# Patient Record
Sex: Female | Born: 1970
Health system: Southern US, Community
[De-identification: ages and names within clinical notes are randomized; demographics above are authoritative.]

## PROBLEM LIST (undated history)

## (undated) DIAGNOSIS — Z98811 Dental restoration status: Secondary | ICD-10-CM

## (undated) DIAGNOSIS — R112 Nausea with vomiting, unspecified: Secondary | ICD-10-CM

## (undated) DIAGNOSIS — Z8673 Personal history of transient ischemic attack (TIA), and cerebral infarction without residual deficits: Secondary | ICD-10-CM

## (undated) DIAGNOSIS — Q2112 Patent foramen ovale: Secondary | ICD-10-CM

## (undated) DIAGNOSIS — S62619A Displaced fracture of proximal phalanx of unspecified finger, initial encounter for closed fracture: Secondary | ICD-10-CM

## (undated) DIAGNOSIS — Q211 Atrial septal defect: Secondary | ICD-10-CM

## (undated) DIAGNOSIS — G43909 Migraine, unspecified, not intractable, without status migrainosus: Secondary | ICD-10-CM

## (undated) DIAGNOSIS — Z9889 Other specified postprocedural states: Secondary | ICD-10-CM

## (undated) DIAGNOSIS — R0989 Other specified symptoms and signs involving the circulatory and respiratory systems: Secondary | ICD-10-CM

## (undated) DIAGNOSIS — S80811A Abrasion, right lower leg, initial encounter: Secondary | ICD-10-CM

## (undated) HISTORY — PX: MASTOIDECTOMY: SHX711

## (undated) HISTORY — PX: TMJ ARTHROPLASTY: SHX1066

## (undated) HISTORY — PX: TYMPANOSTOMY TUBE PLACEMENT: SHX32

---

## 2006-05-14 ENCOUNTER — Emergency Department (HOSPITAL_COMMUNITY): Admission: EM | Admit: 2006-05-14 | Discharge: 2006-05-14 | Payer: Self-pay | Admitting: Family Medicine

## 2006-12-10 ENCOUNTER — Ambulatory Visit (HOSPITAL_COMMUNITY): Admission: RE | Admit: 2006-12-10 | Discharge: 2006-12-10 | Payer: Self-pay | Admitting: *Deleted

## 2007-05-18 ENCOUNTER — Ambulatory Visit (HOSPITAL_COMMUNITY): Admission: RE | Admit: 2007-05-18 | Discharge: 2007-05-18 | Payer: Self-pay | Admitting: Gastroenterology

## 2007-06-16 ENCOUNTER — Emergency Department (HOSPITAL_COMMUNITY): Admission: EM | Admit: 2007-06-16 | Discharge: 2007-06-16 | Payer: Self-pay | Admitting: Family Medicine

## 2007-11-10 ENCOUNTER — Emergency Department (HOSPITAL_COMMUNITY): Admission: EM | Admit: 2007-11-10 | Discharge: 2007-11-10 | Payer: Self-pay | Admitting: Family Medicine

## 2008-05-10 ENCOUNTER — Emergency Department (HOSPITAL_COMMUNITY): Admission: EM | Admit: 2008-05-10 | Discharge: 2008-05-10 | Payer: Self-pay | Admitting: Emergency Medicine

## 2008-09-27 ENCOUNTER — Ambulatory Visit (HOSPITAL_COMMUNITY): Admission: RE | Admit: 2008-09-27 | Discharge: 2008-09-27 | Payer: Self-pay | Admitting: Obstetrics

## 2008-11-27 ENCOUNTER — Ambulatory Visit (HOSPITAL_COMMUNITY): Admission: RE | Admit: 2008-11-27 | Discharge: 2008-11-27 | Payer: Self-pay | Admitting: Obstetrics

## 2008-12-24 ENCOUNTER — Inpatient Hospital Stay (HOSPITAL_COMMUNITY): Admission: AD | Admit: 2008-12-24 | Discharge: 2008-12-27 | Payer: Self-pay | Admitting: Obstetrics and Gynecology

## 2009-01-01 ENCOUNTER — Ambulatory Visit (HOSPITAL_COMMUNITY): Admission: RE | Admit: 2009-01-01 | Discharge: 2009-01-01 | Payer: Self-pay | Admitting: Obstetrics

## 2009-02-23 ENCOUNTER — Inpatient Hospital Stay (HOSPITAL_COMMUNITY): Admission: AD | Admit: 2009-02-23 | Discharge: 2009-02-24 | Payer: Self-pay | Admitting: Obstetrics

## 2009-03-12 ENCOUNTER — Inpatient Hospital Stay (HOSPITAL_COMMUNITY): Admission: AD | Admit: 2009-03-12 | Discharge: 2009-03-12 | Payer: Self-pay | Admitting: Obstetrics and Gynecology

## 2009-04-21 DIAGNOSIS — Z8673 Personal history of transient ischemic attack (TIA), and cerebral infarction without residual deficits: Secondary | ICD-10-CM

## 2009-04-21 HISTORY — DX: Personal history of transient ischemic attack (TIA), and cerebral infarction without residual deficits: Z86.73

## 2009-04-25 ENCOUNTER — Inpatient Hospital Stay (HOSPITAL_COMMUNITY): Admission: RE | Admit: 2009-04-25 | Discharge: 2009-04-28 | Payer: Self-pay | Admitting: Obstetrics

## 2009-04-25 ENCOUNTER — Encounter (INDEPENDENT_AMBULATORY_CARE_PROVIDER_SITE_OTHER): Payer: Self-pay | Admitting: Obstetrics

## 2009-07-06 ENCOUNTER — Emergency Department (HOSPITAL_COMMUNITY): Admission: EM | Admit: 2009-07-06 | Discharge: 2009-07-06 | Payer: Self-pay | Admitting: Emergency Medicine

## 2009-09-28 ENCOUNTER — Emergency Department (HOSPITAL_COMMUNITY): Admission: EM | Admit: 2009-09-28 | Discharge: 2009-09-28 | Payer: Self-pay | Admitting: Family Medicine

## 2010-02-27 ENCOUNTER — Emergency Department (HOSPITAL_COMMUNITY): Admission: EM | Admit: 2010-02-27 | Discharge: 2010-02-27 | Payer: Self-pay | Admitting: Emergency Medicine

## 2010-07-02 LAB — POCT PREGNANCY, URINE: Preg Test, Ur: NEGATIVE

## 2010-07-02 LAB — CBC
MCH: 28.2 pg (ref 26.0–34.0)
MCV: 84.4 fL (ref 78.0–100.0)
Platelets: 257 10*3/uL (ref 150–400)
RDW: 15.1 % (ref 11.5–15.5)

## 2010-07-02 LAB — DIFFERENTIAL
Eosinophils Absolute: 0 10*3/uL (ref 0.0–0.7)
Eosinophils Relative: 0 % (ref 0–5)
Lymphs Abs: 1.8 10*3/uL (ref 0.7–4.0)

## 2010-07-02 LAB — URINALYSIS, ROUTINE W REFLEX MICROSCOPIC
Bilirubin Urine: NEGATIVE
Glucose, UA: NEGATIVE mg/dL
Ketones, ur: NEGATIVE mg/dL
Protein, ur: 100 mg/dL — AB

## 2010-07-02 LAB — POCT I-STAT, CHEM 8
BUN: 13 mg/dL (ref 6–23)
Calcium, Ion: 1.13 mmol/L (ref 1.12–1.32)
TCO2: 27 mmol/L (ref 0–100)

## 2010-07-02 LAB — URINE MICROSCOPIC-ADD ON

## 2010-07-02 LAB — URINE CULTURE

## 2010-07-02 LAB — POCT RAPID STREP A (OFFICE): Streptococcus, Group A Screen (Direct): NEGATIVE

## 2010-07-07 LAB — CBC
HCT: 24.3 % — ABNORMAL LOW (ref 36.0–46.0)
HCT: 35.7 % — ABNORMAL LOW (ref 36.0–46.0)
Hemoglobin: 11.8 g/dL — ABNORMAL LOW (ref 12.0–15.0)
Hemoglobin: 8.2 g/dL — ABNORMAL LOW (ref 12.0–15.0)
Platelets: 171 10*3/uL (ref 150–400)
Platelets: 186 10*3/uL (ref 150–400)
RBC: 2.83 MIL/uL — ABNORMAL LOW (ref 3.87–5.11)
RBC: 3.99 MIL/uL (ref 3.87–5.11)
WBC: 12.2 10*3/uL — ABNORMAL HIGH (ref 4.0–10.5)
WBC: 15.6 10*3/uL — ABNORMAL HIGH (ref 4.0–10.5)
WBC: 16.1 10*3/uL — ABNORMAL HIGH (ref 4.0–10.5)

## 2010-07-07 LAB — GLUCOSE, CAPILLARY
Glucose-Capillary: 70 mg/dL (ref 70–99)
Glucose-Capillary: 79 mg/dL (ref 70–99)

## 2010-07-15 LAB — URINE MICROSCOPIC-ADD ON

## 2010-07-15 LAB — DIFFERENTIAL
Basophils Absolute: 0 10*3/uL (ref 0.0–0.1)
Eosinophils Relative: 7 % — ABNORMAL HIGH (ref 0–5)
Lymphocytes Relative: 24 % (ref 12–46)
Monocytes Absolute: 0.6 10*3/uL (ref 0.1–1.0)
Monocytes Relative: 7 % (ref 3–12)
Neutro Abs: 5 10*3/uL (ref 1.7–7.7)

## 2010-07-15 LAB — URINALYSIS, ROUTINE W REFLEX MICROSCOPIC
Glucose, UA: NEGATIVE mg/dL
Specific Gravity, Urine: 1.034 — ABNORMAL HIGH (ref 1.005–1.030)
Urobilinogen, UA: 2 mg/dL — ABNORMAL HIGH (ref 0.0–1.0)
pH: 5 (ref 5.0–8.0)

## 2010-07-15 LAB — CBC
HCT: 36 % (ref 36.0–46.0)
Hemoglobin: 11.6 g/dL — ABNORMAL LOW (ref 12.0–15.0)
RBC: 4.18 MIL/uL (ref 3.87–5.11)
RDW: 15.8 % — ABNORMAL HIGH (ref 11.5–15.5)

## 2010-07-15 LAB — BASIC METABOLIC PANEL
CO2: 26 mEq/L (ref 19–32)
GFR calc Af Amer: >60 mL/min (ref >60)
GFR calc non Af Amer: >60 mL/min (ref >60)
Glucose, Bld: 103 mg/dL — ABNORMAL HIGH (ref 70–99)
Potassium: 3.9 mEq/L (ref 3.5–5.1)
Sodium: 139 mEq/L (ref 135–145)

## 2010-07-23 ENCOUNTER — Other Ambulatory Visit: Payer: Self-pay | Admitting: Obstetrics

## 2010-07-24 LAB — CBC
HCT: 34 % — ABNORMAL LOW (ref 36.0–46.0)
Hemoglobin: 11.8 g/dL — ABNORMAL LOW (ref 12.0–15.0)
MCHC: 33 g/dL (ref 30.0–36.0)
Platelets: 233 10*3/uL (ref 150–400)
Platelets: 223 10*3/uL (ref 150–400)
RDW: 14.1 % (ref 11.5–15.5)
WBC: 12.4 10*3/uL — ABNORMAL HIGH (ref 4.0–10.5)

## 2010-07-24 LAB — CULTURE, BETA STREP (GROUP B ONLY)

## 2010-07-24 LAB — URINALYSIS, ROUTINE W REFLEX MICROSCOPIC
Bilirubin Urine: NEGATIVE
Glucose, UA: NEGATIVE mg/dL
Ketones, ur: NEGATIVE mg/dL
Leukocytes, UA: NEGATIVE
pH: 6 (ref 5.0–8.0)

## 2010-07-24 LAB — URINE MICROSCOPIC-ADD ON

## 2010-07-24 LAB — FETAL FIBRONECTIN: Fetal Fibronectin: NEGATIVE

## 2010-07-26 LAB — DIFFERENTIAL
Eosinophils Absolute: 0.2 10*3/uL (ref 0.0–0.7)
Eosinophils Relative: 0 % (ref 0–5)
Eosinophils Relative: 2 % (ref 0–5)
Lymphocytes Relative: 28 % (ref 12–46)
Lymphocytes Relative: 9 % — ABNORMAL LOW (ref 12–46)
Lymphs Abs: 1.5 10*3/uL (ref 0.7–4.0)
Lymphs Abs: 3.1 10*3/uL (ref 0.7–4.0)
Monocytes Absolute: 1 10*3/uL (ref 0.1–1.0)
Monocytes Absolute: 1 10*3/uL (ref 0.1–1.0)
Neutro Abs: 13.5 10*3/uL — ABNORMAL HIGH (ref 1.7–7.7)

## 2010-07-26 LAB — URINALYSIS, ROUTINE W REFLEX MICROSCOPIC
Bilirubin Urine: NEGATIVE
Ketones, ur: 15 mg/dL — AB
Protein, ur: 100 mg/dL — AB
Urobilinogen, UA: 4 mg/dL — ABNORMAL HIGH (ref 0.0–1.0)

## 2010-07-26 LAB — COMPREHENSIVE METABOLIC PANEL
AST: 29 U/L (ref 0–37)
Albumin: 2.9 g/dL — ABNORMAL LOW (ref 3.5–5.2)
BUN: 6 mg/dL (ref 6–23)
Calcium: 8.7 mg/dL (ref 8.4–10.5)
Creatinine, Ser: 0.66 mg/dL (ref 0.4–1.2)
GFR calc Af Amer: >60 mL/min (ref >60)

## 2010-07-26 LAB — CBC
HCT: 30.5 % — ABNORMAL LOW (ref 36.0–46.0)
Hemoglobin: 10.6 g/dL — ABNORMAL LOW (ref 12.0–15.0)
MCHC: 34.2 g/dL (ref 30.0–36.0)
MCV: 94.2 fL (ref 78.0–100.0)
MCV: 95.1 fL (ref 78.0–100.0)
Platelets: 198 10*3/uL (ref 150–400)
Platelets: 215 10*3/uL (ref 150–400)
RDW: 13.3 % (ref 11.5–15.5)
WBC: 11.1 10*3/uL — ABNORMAL HIGH (ref 4.0–10.5)

## 2010-07-26 LAB — URINE CULTURE: Colony Count: 35000

## 2010-07-26 LAB — URINE MICROSCOPIC-ADD ON

## 2010-09-03 NOTE — Op Note (Signed)
Christina Goodman, Christina Goodman                ACCOUNT NO.:  [id number]   MEDICAL RECORD NO.:  [id number]          PATIENT TYPE:  AMB   LOCATION:  ENDO                         FACILITY:  [facility]   PHYSICIAN:  John C. Madilyn Fireman, M.D.    DATE OF BIRTH:  20-Dec-1970   DATE OF PROCEDURE:  05/18/2007  DATE OF DISCHARGE:                               OPERATIVE REPORT   PROCEDURE:  Colonoscopy.   INDICATIONS FOR PROCEDURE:  Intermittent rectal bleeding with no obvious  perianal source, 40 year old patient with no prior colon screening.   PROCEDURE:  The patient was placed in the left lateral decubitus  position and placed on pulse monitor with continuous low-flow oxygen  delivered by nasal cannula.  She was sedated with 125 mcg IV fentanyl  and 10 mg IV Versed.  The Olympus video colonoscope was inserted into  the rectum and advanced to the cecum, confirmed by transillumination of  McBurney's point and visualization of ileocecal valve and appendiceal  orifice.  The prep was excellent.  The Olympus video colonoscope was  inserted into the rectum and advanced to the cecum, confirmed by  transillumination of McBurney's point and visualization of ileocecal  valve and appendiceal orifice.  The prep was excellent.  The cecum,  ascending, transverse, descending and sigmoid colon all appeared normal  with no masses, polyps, diverticula or other mucosal abnormalities.  The  rectum likewise appeared normal and retroflexed view of the anus  revealed only small internal hemorrhoids.  The scope was then withdrawn  and the patient returned to the recovery room in stable condition.  She  tolerated the procedure well and there were no immediate complications.   IMPRESSION:  1. Minimal internal hemorrhoids otherwise normal study.   PLAN:  Will treat hemorrhoids symptomatically and plan repeat  colonoscopy in age 57.           ______________________________  Everardo All. Madilyn Fireman, M.D.     J[facility]/MEDQ  D:  05/18/2007  T:   05/18/2007  Job:  [id number]

## 2011-01-10 LAB — POCT RAPID STREP A: Streptococcus, Group A Screen (Direct): NEGATIVE

## 2011-05-08 ENCOUNTER — Emergency Department (HOSPITAL_COMMUNITY)
Admission: EM | Admit: 2011-05-08 | Discharge: 2011-05-08 | Disposition: A | Discharge status: Home / Self Care | Payer: 59 | Source: Home / Self Care | Attending: Family Medicine | Admitting: Family Medicine

## 2011-05-08 ENCOUNTER — Encounter (HOSPITAL_COMMUNITY): Payer: Self-pay | Admitting: *Deleted

## 2011-05-08 ENCOUNTER — Emergency Department (INDEPENDENT_AMBULATORY_CARE_PROVIDER_SITE_OTHER): Payer: 59

## 2011-05-08 DIAGNOSIS — E860 Dehydration: Secondary | ICD-10-CM

## 2011-05-08 DIAGNOSIS — J40 Bronchitis, not specified as acute or chronic: Secondary | ICD-10-CM

## 2011-05-08 MED ORDER — IBUPROFEN 600 MG PO TABS: 600.0000 mg | ORAL_TABLET | Freq: Three times a day (TID) | ORAL | Status: AC | PRN

## 2011-05-08 MED ORDER — ONDANSETRON HCL 4 MG PO TABS: 4.0000 mg | ORAL_TABLET | Freq: Three times a day (TID) | ORAL | Status: AC | PRN

## 2011-05-08 MED ORDER — PREDNISONE 20 MG PO TABS: ORAL_TABLET | ORAL | Status: AC

## 2011-05-08 MED ORDER — AZITHROMYCIN 250 MG PO TABS: 250.0000 mg | ORAL_TABLET | Freq: Every day | ORAL | Status: AC

## 2011-05-08 MED ORDER — HYDROCODONE-ACETAMINOPHEN 7.5-500 MG/15ML PO SOLN: 15.0000 mL | Freq: Three times a day (TID) | ORAL | Status: AC | PRN

## 2011-05-08 NOTE — ED Notes (Signed)
Pt is here with complaints of flu like symptoms with complaints of fever,  Cough with yellow sputum, body aches and sore throat since 05/02/11.

## 2011-05-08 NOTE — ED Provider Notes (Signed)
History     CSN: [id number]  Arrival date & time 05/08/11  4098   First MD Initiated Contact with Patient 05/08/11 0831      Chief Complaint  Patient presents with  . Cough  . Headache  . Sore Throat    (Consider location/radiation/quality/duration/timing/severity/associated sxs/prior treatment) HPI Comments: 41 y/o female h/o POS here c/o body aches, sore throat, cough and congestion for 6 days. New onset of fever since last night also persistent cough with yellow sputum. Has had nausea, vomiting and diarrhea initially last emesis yesterday. Decreased appetite, no chest or abdominal pain, no shortness of breath.    Past Medical History  Diagnosis Date  . Migraine     Past Surgical History  Procedure Date  . Tympanostomy tube placement   . Mastoidectomy   . Tmj arthroplasty     History reviewed. No pertinent family history.  History  Substance Use Topics  . Smoking status: Never Smoker   . Smokeless tobacco: Not on file  . Alcohol Use: No    OB History    Grav Para Term Preterm Abortions TAB SAB Ect Mult Living                  Review of Systems  Constitutional: Positive for fever, chills and appetite change.  HENT: Positive for congestion, sore throat and rhinorrhea.   Eyes: Negative for discharge.  Respiratory: Positive for cough. Negative for shortness of breath.   Cardiovascular: Negative for palpitations and leg swelling.  Gastrointestinal: Positive for nausea, vomiting and diarrhea. Negative for abdominal pain.  Musculoskeletal: Positive for myalgias and arthralgias.  Skin: Negative for rash.    Allergies  Amoxicillin; Ivp dye; Penicillins cross reactors; and Sulfa antibiotics  Home Medications   Current Outpatient Rx  Name Route Sig Dispense Refill  . METFORMIN HCL 500 MG PO TABS Oral Take 500 mg by mouth 2 (two) times daily with a meal.    . AZITHROMYCIN 250 MG PO TABS Oral Take 1 tablet (250 mg total) by mouth daily. Take first 2 tablets  together, then 1 every day until finished. 6 tablet 0  . HYDROCODONE-ACETAMINOPHEN 7.5-500 MG/15ML PO SOLN Oral Take 15 mLs by mouth every 8 (eight) hours as needed for pain or cough. 120 mL 0  . IBUPROFEN 600 MG PO TABS Oral Take 1 tablet (600 mg total) by mouth every 8 (eight) hours as needed for pain or fever (take with food). 20 tablet 0  . ONDANSETRON HCL 4 MG PO TABS Oral Take 1 tablet (4 mg total) by mouth every 8 (eight) hours as needed for nausea. 10 tablet 0  . PREDNISONE 20 MG PO TABS  2 tabs po daily for 5 days 10 tablet no    BP 124/76  Pulse 86  Temp(Src) 99.1 F (37.3 C) (Oral)  SpO2 97%  LMP 03/08/2011  Physical Exam  Nursing note and vitals reviewed. Constitutional: She is oriented to person, place, and time. She appears well-developed and well-nourished. No distress.  HENT:  Head: Normocephalic.       Nasal Congestion with erythema and swelling of nasal turbinates, clear rhinorrhea. Septal deviation to left side with partial occlusion on left nasal passage. Pharyngeal erythema no exudates. No uvula deviation. No trismus. TM's with increased vascular markings and some dullness bilaterally no swelling or bulging   Eyes: Conjunctivae and EOM are normal. Pupils are equal, round, and reactive to light. Right eye exhibits no discharge. Left eye exhibits no discharge. No scleral icterus.  Neck: Neck supple.  Pulmonary/Chest: No respiratory distress.       Sporadic bilateral rhonchi that resolved after repeated deep breathing. No active wheezing. No crackles, no orthopnea or tachypnea.  Lymphadenopathy:    She has no cervical adenopathy.  Neurological: She is alert and oriented to person, place, and time.  Skin: No rash noted.    ED Course  Procedures (including critical care time)  Labs Reviewed - No data to display Dg Chest 2 View  05/08/2011  *RADIOLOGY REPORT*  Clinical Data: Cough, fever  CHEST - 2 VIEW  Comparison: 02/27/2010  Findings: Cardiomediastinal  silhouette is stable.  No acute infiltrate or pleural effusion.  No pulmonary edema.  Minimal central increased bronchial markings without focal consolidation.  IMPRESSION: .  No acute infiltrate or pulmonary edema.  Minimal central increased bronchial markings without focal consolidation.  Original Report Authenticated By: Natasha Mead, M.D.     1. Dehydration   2. Bronchitis       MDM  Likely influenza related bronchitis. Patient was able to drink from hydration cup with no emesis. Tachycardia and blood pressure improved with oral hydration. Prescribed azithromycin, prednisone, hydrocodone, ondansetron and ibuprofen. Asked to return if chest pain difficulty breathing or worsening symptoms despite following treatment.         Sharin Grave, MD 05/09/11 [phone]

## 2011-07-05 ENCOUNTER — Encounter (HOSPITAL_COMMUNITY): Payer: Self-pay | Admitting: *Deleted

## 2011-07-05 ENCOUNTER — Emergency Department (HOSPITAL_COMMUNITY)
Admission: EM | Admit: 2011-07-05 | Discharge: 2011-07-05 | Disposition: A | Discharge status: Home / Self Care | Payer: 59 | Source: Home / Self Care | Attending: Family Medicine | Admitting: Family Medicine

## 2011-07-05 DIAGNOSIS — J069 Acute upper respiratory infection, unspecified: Secondary | ICD-10-CM

## 2011-07-05 LAB — POCT RAPID STREP A: Streptococcus, Group A Screen (Direct): NEGATIVE

## 2011-07-05 MED ORDER — AZITHROMYCIN 250 MG PO TABS: ORAL_TABLET | ORAL | Status: AC

## 2011-07-05 NOTE — Discharge Instructions (Signed)
Drink lots of fluids, salt water gargle, use lozenges as needed. Fill antibiotic prescription on sun if sore throat continues,return if needed

## 2011-07-05 NOTE — ED Provider Notes (Signed)
History     CSN: [id number]  Arrival date & time 07/05/11  1355   First MD Initiated Contact with Patient 07/05/11 1401      Chief Complaint  Patient presents with  . Sore Throat  . Fever  . Generalized Body Aches    (Consider location/radiation/quality/duration/timing/severity/associated sxs/prior treatment) Patient is a 41 y.o. female presenting with pharyngitis and fever. The history is provided by the patient.  Sore Throat This is a new problem. The current episode started 2 days ago. The problem has been gradually worsening. The symptoms are aggravated by swallowing.  Fever Primary symptoms of the febrile illness include fever and cough.    Past Medical History  Diagnosis Date  . Migraine   . Polycystic ovarian disease     Past Surgical History  Procedure Date  . Tympanostomy tube placement   . Mastoidectomy   . Tmj arthroplasty     History reviewed. No pertinent family history.  History  Substance Use Topics  . Smoking status: Never Smoker   . Smokeless tobacco: Not on file  . Alcohol Use: No    OB History    Grav Para Term Preterm Abortions TAB SAB Ect Mult Living                  Review of Systems  Constitutional: Positive for fever.  HENT: Positive for congestion, sore throat and rhinorrhea.   Respiratory: Positive for cough.   Gastrointestinal: Negative.     Allergies  Amoxicillin; Ivp dye; Penicillins cross reactors; and Sulfa antibiotics  Home Medications   Current Outpatient Rx  Name Route Sig Dispense Refill  . ACETAMINOPHEN 500 MG PO TABS Oral Take 500 mg by mouth every 6 (six) hours as needed.    Marland Kitchen METFORMIN HCL 500 MG PO TABS Oral Take 500 mg by mouth 2 (two) times daily with a meal.    . AZITHROMYCIN 250 MG PO TABS  Take as directed on pack 6 each 0    BP 127/76  Pulse 120  Temp(Src) 100.3 F (37.9 C) (Oral)  Resp 22  SpO2 98%  LMP 05/31/2011  Physical Exam  Nursing note and vitals reviewed. Constitutional: She is  oriented to person, place, and time. She appears well-developed and well-nourished.  HENT:  Head: Normocephalic.  Right Ear: External ear normal.  Left Ear: External ear normal.  Nose: Nose normal.  Mouth/Throat: Oropharynx is clear and moist.  Eyes: Conjunctivae are normal. Pupils are equal, round, and reactive to light.  Neck: Normal range of motion. Neck supple.  Cardiovascular: Normal rate, regular rhythm, normal heart sounds and intact distal pulses.   Pulmonary/Chest: Breath sounds normal.  Lymphadenopathy:    She has no cervical adenopathy.  Neurological: She is alert and oriented to person, place, and time.  Skin: Skin is warm and dry.    ED Course  Procedures (including critical care time)   Labs Reviewed  POCT RAPID STREP A ([facility] ONLY)   No results found.   1. URI (upper respiratory infection)       MDM          Linna Hoff, MD 07/05/11 [phone]

## 2011-07-05 NOTE — ED Notes (Signed)
Pt with onset of sore throat/ aching/cough  and fever onset Thursday night worse today - tylenol at 1130am

## 2012-06-05 ENCOUNTER — Other Ambulatory Visit: Payer: Self-pay

## 2013-02-24 ENCOUNTER — Other Ambulatory Visit: Payer: Self-pay

## 2013-06-24 ENCOUNTER — Other Ambulatory Visit (HOSPITAL_COMMUNITY): Payer: Self-pay | Admitting: Neurology

## 2013-06-24 DIAGNOSIS — R519 Headache, unspecified: Secondary | ICD-10-CM

## 2013-06-24 DIAGNOSIS — R51 Headache: Principal | ICD-10-CM

## 2013-06-27 ENCOUNTER — Ambulatory Visit (HOSPITAL_COMMUNITY)
Admission: RE | Admit: 2013-06-27 | Discharge: 2013-06-27 | Disposition: A | Discharge status: Home / Self Care | Payer: 59 | Source: Ambulatory Visit | Attending: Neurology | Admitting: Neurology

## 2013-06-27 DIAGNOSIS — G43909 Migraine, unspecified, not intractable, without status migrainosus: Secondary | ICD-10-CM | POA: Insufficient documentation

## 2013-06-27 DIAGNOSIS — R519 Headache, unspecified: Secondary | ICD-10-CM

## 2013-06-27 DIAGNOSIS — R110 Nausea: Secondary | ICD-10-CM | POA: Insufficient documentation

## 2013-06-27 DIAGNOSIS — R51 Headache: Secondary | ICD-10-CM

## 2013-06-29 ENCOUNTER — Other Ambulatory Visit (HOSPITAL_COMMUNITY): Payer: Self-pay | Admitting: Neurology

## 2013-06-29 DIAGNOSIS — I898 Other specified noninfective disorders of lymphatic vessels and lymph nodes: Secondary | ICD-10-CM

## 2013-06-30 ENCOUNTER — Ambulatory Visit (HOSPITAL_COMMUNITY): Payer: 59

## 2013-06-30 ENCOUNTER — Ambulatory Visit (HOSPITAL_COMMUNITY)
Admission: RE | Admit: 2013-06-30 | Discharge: 2013-06-30 | Disposition: A | Discharge status: Home / Self Care | Payer: 59 | Source: Ambulatory Visit | Attending: Neurology | Admitting: Neurology

## 2013-06-30 DIAGNOSIS — I898 Other specified noninfective disorders of lymphatic vessels and lymph nodes: Secondary | ICD-10-CM

## 2013-06-30 DIAGNOSIS — I658 Occlusion and stenosis of other precerebral arteries: Secondary | ICD-10-CM | POA: Insufficient documentation

## 2013-06-30 DIAGNOSIS — Z8673 Personal history of transient ischemic attack (TIA), and cerebral infarction without residual deficits: Secondary | ICD-10-CM | POA: Insufficient documentation

## 2013-06-30 DIAGNOSIS — R51 Headache: Secondary | ICD-10-CM | POA: Insufficient documentation

## 2013-06-30 MED ORDER — GADOBENATE DIMEGLUMINE 529 MG/ML IV SOLN
20.0000 mL | Freq: Once | INTRAVENOUS | Status: AC | PRN
  Administered 2013-06-30: 17 mL via INTRAVENOUS

## 2013-07-01 ENCOUNTER — Other Ambulatory Visit (HOSPITAL_COMMUNITY): Payer: Self-pay | Admitting: Neurology

## 2013-07-01 DIAGNOSIS — R9389 Abnormal findings on diagnostic imaging of other specified body structures: Secondary | ICD-10-CM

## 2013-07-04 ENCOUNTER — Ambulatory Visit (HOSPITAL_COMMUNITY)
Admission: RE | Admit: 2013-07-04 | Discharge: 2013-07-04 | Disposition: A | Discharge status: Home / Self Care | Payer: 59 | Source: Ambulatory Visit | Attending: Neurology | Admitting: Neurology

## 2013-07-04 DIAGNOSIS — R9389 Abnormal findings on diagnostic imaging of other specified body structures: Secondary | ICD-10-CM

## 2013-07-04 DIAGNOSIS — Q283 Other malformations of cerebral vessels: Secondary | ICD-10-CM | POA: Insufficient documentation

## 2013-07-04 MED ORDER — IOHEXOL 350 MG/ML SOLN
50.0000 mL | Freq: Once | INTRAVENOUS | Status: AC | PRN
  Administered 2013-07-04: 50 mL via INTRAVENOUS

## 2013-07-14 ENCOUNTER — Encounter: Payer: Self-pay | Admitting: Cardiovascular Disease

## 2013-08-02 ENCOUNTER — Encounter: Payer: Self-pay | Admitting: Cardiovascular Disease

## 2013-08-02 ENCOUNTER — Ambulatory Visit (INDEPENDENT_AMBULATORY_CARE_PROVIDER_SITE_OTHER): Payer: 59 | Admitting: Cardiovascular Disease

## 2013-08-02 VITALS — BP 112/88 | HR 73 | Ht 69.0 in | Wt 193.0 lb

## 2013-08-02 DIAGNOSIS — G43909 Migraine, unspecified, not intractable, without status migrainosus: Secondary | ICD-10-CM

## 2013-08-02 DIAGNOSIS — I639 Cerebral infarction, unspecified: Secondary | ICD-10-CM | POA: Insufficient documentation

## 2013-08-02 DIAGNOSIS — I6350 Cerebral infarction due to unspecified occlusion or stenosis of unspecified cerebral artery: Secondary | ICD-10-CM

## 2013-08-02 NOTE — Patient Instructions (Signed)
Your physician recommends that you schedule a follow-up appointment as needed with Dr. Angelena Form   Your physician has requested that you have an echocardiogram with bubble study. . Echocardiography is a painless test that uses sound waves to create images of your heart. It provides your doctor with information about the size and shape of your heart and how well your heart's chambers and valves are working. This procedure takes approximately one hour. There are no restrictions for this procedure.

## 2013-08-02 NOTE — Progress Notes (Signed)
History of Present Illness: 42 yo female with history of migraine headaches, polycystic ovarian disease who is here today as a new patient for discussion regarding arranging an echo to exclude PFO/ASD. She has a long history of migraine headaches. Recent brain MRI with possible cerebellar infarction. No prior cardiac murmurs. No prior cardiac conditions. She has had no chest pain or SOB. She does describe over the last two weeks feeling her heart "pound" and then skip a beat followed by dyspnea. No near syncope or syncope.   Primary Care Physician: Michel Santee  Past Medical History  Diagnosis Date  . Migraine   . Polycystic ovarian disease     Past Surgical History  Procedure Laterality Date  . Tympanostomy tube placement    . Mastoidectomy    . Tmj arthroplasty      Current Outpatient Prescriptions  Medication Sig Dispense Refill  . aspirin 81 MG tablet Take 81 mg by mouth daily.      . cyclobenzaprine (FLEXERIL) 5 MG tablet Take 5 mg by mouth 3 (three) times daily as needed for muscle spasms.      . metFORMIN (GLUCOPHAGE) 500 MG tablet Take 500 mg by mouth 2 (two) times daily with a meal.      . oxaprozin (DAYPRO) 600 MG tablet Take by mouth daily.      Marland Kitchen oxyCODONE-acetaminophen (PERCOCET/ROXICET) 5-325 MG per tablet Take by mouth every 4 (four) hours as needed for severe pain.       No current facility-administered medications for this visit.    Allergies  Allergen Reactions  . Amoxicillin   . Ivp Dye [Iodinated Diagnostic Agents]     Pt did well with 13 hour premedication prior to cta neck on 07/04/2013  . Penicillins Cross Reactors     All "cillin" drugs!!  . Sulfa Antibiotics     History   Social History  . Marital Status: Single    Spouse Name: N/A    Number of Children: 0  . 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 Smoking status: Never Smoker   . Smokeless tobacco: Not on file  .  Alcohol Use: No  . Drug Use: No  . Sexual Activity: Yes    Birth Control/ Protection: Pill   Other Topics Concern  . Not on file   Social History Narrative  . No narrative on file    Family History  Problem Relation Age of Onset  . Cancer Mother 17  . CAD Father   . Heart attack Father   . Heart attack Paternal Grandfather   . Heart attack Paternal Grandmother   . Heart attack Maternal Grandfather   . Heart attack Maternal Grandmother     Review of Systems:  As stated in the HPI and otherwise negative.   BP 112/88  Pulse 73  Ht 5\' 9"  (1.753 m)  Wt 193 lb (87.544 kg)  BMI 28.49 kg/m2  Physical Examination: General: Well developed, well nourished, NAD HEENT: OP clear, mucus membranes moist SKIN: warm, dry. No rashes. Neuro: No focal deficits Musculoskeletal: Muscle strength 5/5 all ext Psychiatric: Mood and affect normal Neck: No JVD, no carotid bruits, no thyromegaly, no lymphadenopathy. Lungs:Clear bilaterally, no wheezes, rhonci, crackles Cardiovascular: Regular rate and rhythm. No murmurs, gallops or rubs. Abdomen:Soft. Bowel sounds present. Non-tender.  Extremities: No lower extremity edema. Pulses are 2 + in the bilateral DP/PT.  EKG:NSR, rate  73 bpm.   Assessment and Plan:   1. CVA: Definite abnormality on MRI/Head CT. She has no prior cardiac murmurs or cardiac conditions. She will need a transthoracic echo with bubble study to exclude PFO/ASD. TEE is not indicated at this time. If her bubble study is negative, it will be safe to assume that a atrial septal defect is not present. We will call her with the results of the echo.   2. Palpitations: She has rare palpitations over the last two weeks. This feels like a skipped beat. No prolonged tachycardia. If this persists, she will need a 48 hour monitor. Likely related to premature beats.

## 2013-08-11 ENCOUNTER — Encounter (HOSPITAL_COMMUNITY): Payer: Self-pay | Admitting: *Deleted

## 2013-08-11 ENCOUNTER — Ambulatory Visit (HOSPITAL_COMMUNITY): Payer: 59 | Attending: Internal Medicine | Admitting: Radiology

## 2013-08-11 ENCOUNTER — Encounter: Payer: Self-pay | Admitting: Internal Medicine

## 2013-08-11 DIAGNOSIS — I639 Cerebral infarction, unspecified: Secondary | ICD-10-CM

## 2013-08-11 DIAGNOSIS — I6350 Cerebral infarction due to unspecified occlusion or stenosis of unspecified cerebral artery: Secondary | ICD-10-CM | POA: Insufficient documentation

## 2013-08-11 DIAGNOSIS — I6789 Other cerebrovascular disease: Secondary | ICD-10-CM

## 2013-08-11 NOTE — Progress Notes (Signed)
Echocardiogram performed with Bubble Study.  

## 2013-08-11 NOTE — Progress Notes (Signed)
Patient ID: Christina Goodman, female   DOB: [birth year], 43 y.o.   MRN: [id number] 22G IV started  In RAC x1 for bubble study.  Site unremarkable. Crissie Figures, RN

## 2013-08-16 ENCOUNTER — Encounter: Payer: Self-pay | Admitting: *Deleted

## 2013-08-16 ENCOUNTER — Telehealth: Payer: Self-pay | Admitting: *Deleted

## 2013-08-16 NOTE — Telephone Encounter (Signed)
Spoke with pt regarding TEE. This has been scheduled for Aug 24, 2013 at 9:00. Pt aware to arrive at [facility] at 7:30 AM. I verbally went over all instructions with pt and will mail copy to her.  She had recent lab work done at Dr. Ovid Curd office and will have results faxed to [place].  She is aware BMP and CBC needed prior to TEE.

## 2013-08-17 NOTE — Addendum Note (Signed)
Addended by: Lauree Chandler D on: 08/17/2013 07:59 AM   Modules accepted: Orders

## 2013-08-22 ENCOUNTER — Other Ambulatory Visit (INDEPENDENT_AMBULATORY_CARE_PROVIDER_SITE_OTHER): Payer: 59

## 2013-08-22 ENCOUNTER — Telehealth: Payer: Self-pay | Admitting: Cardiovascular Disease

## 2013-08-22 DIAGNOSIS — I6350 Cerebral infarction due to unspecified occlusion or stenosis of unspecified cerebral artery: Secondary | ICD-10-CM

## 2013-08-22 NOTE — Telephone Encounter (Signed)
Note from Dr. Ovid Curd office is scanned in but no lab work with it.  I placed call to pt to discuss need for lab work prior to TEE. Left message to call back

## 2013-08-22 NOTE — Telephone Encounter (Signed)
New message ° ° °Patient returning call back to nurse.  °

## 2013-08-22 NOTE — Telephone Encounter (Signed)
Spoke with pt and she will have labs done in our office today or at [facility] or [facility] tomorrow.

## 2013-08-22 NOTE — Telephone Encounter (Signed)
See previous phone note regarding need for lab work prior to TEE.  Pt will have labs done either today in our office or tomorrow at [facility] or [place] depending on where she is working. Pt states she had dental work (crown) done day of echo. She will need follow up dental work. Is asking if she should be on antibiotics prior to dental work.  She is allergic to all penicillin drugs and sulfa antibiotics.

## 2013-08-23 LAB — CBC
HEMATOCRIT: 40.5 % (ref 36.0–46.0)
Hemoglobin: 13.4 g/dL (ref 12.0–15.0)
MCHC: 33 g/dL (ref 30.0–36.0)
MCV: 87.4 fl (ref 78.0–100.0)
Platelets: 304 10*3/uL (ref 150.0–400.0)
RBC: 4.64 Mil/uL (ref 3.87–5.11)
RDW: 13.4 % (ref 11.5–14.6)
WBC: 8.1 10*3/uL (ref 4.5–10.5)

## 2013-08-23 LAB — BASIC METABOLIC PANEL
BUN: 15 mg/dL (ref 6–23)
CALCIUM: 9.8 mg/dL (ref 8.4–10.5)
CO2: 30 mEq/L (ref 19–32)
Chloride: 103 mEq/L (ref 96–112)
Creatinine, Ser: 0.9 mg/dL (ref 0.4–1.2)
GFR: 77.47 mL/min (ref >60.00)
GLUCOSE: 88 mg/dL (ref 70–99)
Potassium: 5 mEq/L (ref 3.5–5.1)
SODIUM: 140 meq/L (ref 135–145)

## 2013-08-24 ENCOUNTER — Encounter (HOSPITAL_COMMUNITY)
Admission: RE | Disposition: A | Discharge status: Home / Self Care | Payer: Self-pay | Source: Ambulatory Visit | Attending: Cardiology

## 2013-08-24 ENCOUNTER — Ambulatory Visit (HOSPITAL_COMMUNITY)
Admission: RE | Admit: 2013-08-24 | Discharge: 2013-08-24 | Disposition: A | Discharge status: Home / Self Care | Payer: 59 | Source: Ambulatory Visit | Attending: Cardiology | Admitting: Cardiology

## 2013-08-24 DIAGNOSIS — I639 Cerebral infarction, unspecified: Secondary | ICD-10-CM

## 2013-08-24 DIAGNOSIS — I6789 Other cerebrovascular disease: Secondary | ICD-10-CM

## 2013-08-24 DIAGNOSIS — E282 Polycystic ovarian syndrome: Secondary | ICD-10-CM | POA: Insufficient documentation

## 2013-08-24 DIAGNOSIS — G43909 Migraine, unspecified, not intractable, without status migrainosus: Secondary | ICD-10-CM | POA: Insufficient documentation

## 2013-08-24 DIAGNOSIS — Q211 Atrial septal defect: Secondary | ICD-10-CM | POA: Insufficient documentation

## 2013-08-24 DIAGNOSIS — Z79899 Other long term (current) drug therapy: Secondary | ICD-10-CM | POA: Insufficient documentation

## 2013-08-24 DIAGNOSIS — Z7982 Long term (current) use of aspirin: Secondary | ICD-10-CM | POA: Insufficient documentation

## 2013-08-24 DIAGNOSIS — Q2111 Secundum atrial septal defect: Secondary | ICD-10-CM | POA: Insufficient documentation

## 2013-08-24 DIAGNOSIS — I6350 Cerebral infarction due to unspecified occlusion or stenosis of unspecified cerebral artery: Secondary | ICD-10-CM | POA: Insufficient documentation

## 2013-08-24 HISTORY — PX: TEE WITHOUT CARDIOVERSION: SHX5443

## 2013-08-24 SURGERY — ECHOCARDIOGRAM, TRANSESOPHAGEAL
Anesthesia: Moderate Sedation

## 2013-08-24 MED ORDER — SODIUM CHLORIDE 0.9 % IV SOLN
INTRAVENOUS | Status: AC
  Administered 2013-08-24: 09:00:00 via INTRAVENOUS

## 2013-08-24 MED ORDER — FENTANYL CITRATE 0.05 MG/ML IJ SOLN
INTRAMUSCULAR | Status: AC
  Filled 2013-08-24: qty 2

## 2013-08-24 MED ORDER — MIDAZOLAM HCL 10 MG/2ML IJ SOLN
INTRAMUSCULAR | Status: DC | PRN
  Administered 2013-08-24: 1 mg via INTRAVENOUS
  Administered 2013-08-24 (×4): 2 mg via INTRAVENOUS

## 2013-08-24 MED ORDER — BUTAMBEN-TETRACAINE-BENZOCAINE 2-2-14 % EX AERO
INHALATION_SPRAY | CUTANEOUS | Status: DC | PRN
  Administered 2013-08-24: 2 via TOPICAL

## 2013-08-24 MED ORDER — FENTANYL CITRATE 0.05 MG/ML IJ SOLN
INTRAMUSCULAR | Status: DC | PRN
Start: 2013-08-24 — End: 2013-08-24
  Administered 2013-08-24: 50 ug via INTRAVENOUS
  Administered 2013-08-24 (×3): 25 ug via INTRAVENOUS

## 2013-08-24 MED ORDER — MIDAZOLAM HCL 5 MG/ML IJ SOLN
INTRAMUSCULAR | Status: AC
  Filled 2013-08-24: qty 2

## 2013-08-24 NOTE — Interval H&P Note (Signed)
History and Physical Interval Note:  08/24/2013 9:24 AM  Christina Goodman  has presented today for surgery, with the diagnosis of PFO  The various methods of treatment have been discussed with the patient and family. After consideration of risks, benefits and other options for treatment, the patient has consented to  Procedure(s): TRANSESOPHAGEAL ECHOCARDIOGRAM (TEE) (N/A) as a surgical intervention .  The patient's history has been reviewed, patient examined, no change in status, stable for surgery.  I have reviewed the patient's chart and labs.  Questions were answered to the patient's satisfaction.     Chelsey Redondo Claris Gladden

## 2013-08-24 NOTE — CV Procedure (Signed)
Procedure: TEE  Indication: CVA  Sedation: Versed 9 mg IV, Fentanyl 125 mcg IV  Findings: Please see echo section for full report.  Normal LV size and systolic function, EF 74-94%. Normal RV size and systolic function.  No significant valvular abnormalities.  There was a moderate-sized PFO present with bubbles crossing right to left at rest.  No LAA thrombus.  No significant thoracic aortic plaque.   PFO present.  This is the only possible source of embolus noted.   Larey Dresser 08/24/2013

## 2013-08-24 NOTE — Progress Notes (Signed)
  Echocardiogram Echocardiogram Transesophageal has been performed.  Christina Goodman Orlean Patten 08/24/2013, 12:16 PM

## 2013-08-24 NOTE — Discharge Instructions (Signed)
Esophagogastroduodenoscopy Care After Refer to this sheet in the next few weeks. These instructions provide you with information on caring for yourself after your procedure. Your caregiver may also give you more specific instructions. Your treatment has been planned according to current medical practices, but problems sometimes occur. Call your caregiver if you have any problems or questions after your procedure.  HOME CARE INSTRUCTIONS Do not eat or drink anything until the numbing medicine (local anesthetic) has worn off and your gag reflex has returned. You will know that the local anesthetic has worn off when you can swallow comfortably. Do not drive for 12 hours after the procedure or as directed by your caregiver. Only take medicines as directed by your caregiver. SEEK MEDICAL CARE IF:  You cannot stop coughing. You are not urinating at all or less than usual. SEEK IMMEDIATE MEDICAL CARE IF: You have difficulty swallowing. You cannot eat or drink. You have worsening throat or chest pain. You have dizziness, lightheadedness, or you faint. You have nausea or vomiting. You have chills. You have a fever. You have severe abdominal pain. You have black, tarry, or bloody stools. Document Released: 03/24/2012 Document Reviewed: 03/24/2012 [facility] Patient Information 2014 [place], [place]. Transesophageal Echocardiography Transesophageal echocardiography (TEE) is a special type of test that produces images of the heart by using sound waves (echocardiogram). This type of echocardiography can obtain better images of the heart than standard echocardiography. TEE is done by passing a flexible tube down the esophagus. The heart is located in front of the esophagus. Because the heart and esophagus are close to one another, your health care provider can take very clear, detailed pictures of the heart via ultrasound waves. TEE may be done:  If your health care provider needs more information based on  standard echocardiography findings.  If you had a stroke. This might have happened because a clot formed in your heart. TEE can visualize different areas of the heart and check for clots.  To check valve anatomy and function.  To check for infection on the inside of your heart (endocarditis).  To evaluate the dividing wall (septum) of the heart and presence of a hole that did not close after birth (patent foramen ovale or atrial septal defect).  To help diagnose a tear in the wall of the aorta (aortic dissection).  During cardiac valve surgery. This allows the surgeon to assess the valve repair before closing the chest.  During a variety of other cardiac procedures to guide positioning of catheters.  Sometimes before a cardioversion, which is a shock to convert heart rhythm back to normal. LET [facility] CARE PROVIDER KNOW ABOUT:   Any allergies you have.  All medicines you are taking, including vitamins, herbs, eye drops, creams, and over-the-counter medicines.  Previous problems you or members of your family have had with the use of anesthetics.  Any blood disorders you have.  Previous surgeries you have had.  Medical conditions you have.  Swallowing difficulties.  An esophageal obstruction. RISKS AND COMPLICATIONS  Generally, TEE is a safe procedure. However, as with any procedure, complications can occur. Possible complications include an esophageal tear (rupture). BEFORE THE PROCEDURE   Do not eat or drink for 6 hours before the procedure or as directed by your health care provider.  Arrange for someone to drive you home after the procedure. Do not drive yourself home. During the procedure, you will be given medicines that can continue to make you feel drowsy and can impair your reflexes.  An IV access tube will be started in the arm. PROCEDURE   A medicine to help you relax (sedative) will be given through the IV access tube.  A medicine that numbs the area  (local anesthetic) may be sprayed in the back of the throat.  Your blood pressure, heart rate, and breathing (vital signs) will be monitored during the procedure.  The TEE probe is a long, flexible tube. The tip of the probe is placed into the back of the mouth, and you will be asked to swallow. This helps to pass the tip of the probe into the esophagus. Once the tip of the probe is in the correct area, your health care provider can take pictures of the heart.  TEE is usually not a painful procedure. You may feel the probe press against the back of the throat. The probe does not enter the trachea and does not affect your breathing.  Your time spent at the hospital is usually less than 2 hours. AFTER THE PROCEDURE   You will be in bed, resting, until you have fully returned to consciousness.  When you first awaken, your throat may feel slightly sore and will probably still feel numb. This will improve slowly over time.  You will not be allowed to eat or drink until it is clear that the numbness has improved.  Once you have been able to drink, urinate, and sit on the edge of the bed without feeling sick to your stomach (nausea) or dizzy, you may be cleared to go home.  You should have a friend or family member with you for the next 24 hours after your procedure. Document Released: 06/28/2002 Document Revised: 01/26/2013 Document Reviewed: 10/07/2012 [facility] Patient Information 2014 [place], [place].

## 2013-08-24 NOTE — H&P (View-Only) (Signed)
History of Present Illness: 42 yo female with history of migraine headaches, polycystic ovarian disease who is here today as a new patient for discussion regarding arranging an echo to exclude PFO/ASD. She has a long history of migraine headaches. Recent brain MRI with possible cerebellar infarction. No prior cardiac murmurs. No prior cardiac conditions. She has had no chest pain or SOB. She does describe over the last two weeks feeling her heart "pound" and then skip a beat followed by dyspnea. No near syncope or syncope.   Primary Care Physician: Michel Santee  Past Medical History  Diagnosis Date  . Migraine   . Polycystic ovarian disease     Past Surgical History  Procedure Laterality Date  . Tympanostomy tube placement    . Mastoidectomy    . Tmj arthroplasty      Current Outpatient Prescriptions  Medication Sig Dispense Refill  . aspirin 81 MG tablet Take 81 mg by mouth daily.      . cyclobenzaprine (FLEXERIL) 5 MG tablet Take 5 mg by mouth 3 (three) times daily as needed for muscle spasms.      . metFORMIN (GLUCOPHAGE) 500 MG tablet Take 500 mg by mouth 2 (two) times daily with a meal.      . oxaprozin (DAYPRO) 600 MG tablet Take by mouth daily.      Marland Kitchen oxyCODONE-acetaminophen (PERCOCET/ROXICET) 5-325 MG per tablet Take by mouth every 4 (four) hours as needed for severe pain.       No current facility-administered medications for this visit.    Allergies  Allergen Reactions  . Amoxicillin   . Ivp Dye [Iodinated Diagnostic Agents]     Pt did well with 13 hour premedication prior to cta neck on 07/04/2013  . Penicillins Cross Reactors     All "cillin" drugs!!  . Sulfa Antibiotics     History   Social History  . Marital Status: Single    Spouse Name: N/A    Number of Children: 0  . 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 Smoking status: Never Smoker   . Smokeless tobacco: Not on file  .  Alcohol Use: No  . Drug Use: No  . Sexual Activity: Yes    Birth Control/ Protection: Pill   Other Topics Concern  . Not on file   Social History Narrative  . No narrative on file    Family History  Problem Relation Age of Onset  . Cancer Mother 17  . CAD Father   . Heart attack Father   . Heart attack Paternal Grandfather   . Heart attack Paternal Grandmother   . Heart attack Maternal Grandfather   . Heart attack Maternal Grandmother     Review of Systems:  As stated in the HPI and otherwise negative.   BP 112/88  Pulse 73  Ht 5\' 9"  (1.753 m)  Wt 193 lb (87.544 kg)  BMI 28.49 kg/m2  Physical Examination: General: Well developed, well nourished, NAD HEENT: OP clear, mucus membranes moist SKIN: warm, dry. No rashes. Neuro: No focal deficits Musculoskeletal: Muscle strength 5/5 all ext Psychiatric: Mood and affect normal Neck: No JVD, no carotid bruits, no thyromegaly, no lymphadenopathy. Lungs:Clear bilaterally, no wheezes, rhonci, crackles Cardiovascular: Regular rate and rhythm. No murmurs, gallops or rubs. Abdomen:Soft. Bowel sounds present. Non-tender.  Extremities: No lower extremity edema. Pulses are 2 + in the bilateral DP/PT.  EKG:NSR, rate  73 bpm.   Assessment and Plan:   1. CVA: Definite abnormality on MRI/Head CT. She has no prior cardiac murmurs or cardiac conditions. She will need a transthoracic echo with bubble study to exclude PFO/ASD. TEE is not indicated at this time. If her bubble study is negative, it will be safe to assume that a atrial septal defect is not present. We will call her with the results of the echo.   2. Palpitations: She has rare palpitations over the last two weeks. This feels like a skipped beat. No prolonged tachycardia. If this persists, she will need a 48 hour monitor. Likely related to premature beats.

## 2013-08-25 ENCOUNTER — Encounter (HOSPITAL_COMMUNITY): Payer: Self-pay | Admitting: Cardiology

## 2013-08-25 NOTE — Telephone Encounter (Signed)
I left her a message on cell at 5:40pm 08/25/13. Tried work number but was told she was not working today. Will try back tomorrow. Darlina Guys

## 2013-08-29 NOTE — Telephone Encounter (Signed)
Spoke with pt and gave her information from [place]. She has followed up with neurology and is being referred to [facility] for possible clinical trial

## 2013-08-29 NOTE — Telephone Encounter (Signed)
She does not need antibiotics prior to dental visits. She should continue ASA. I have discussed her case with our structural heart disease specialist and there is no indication for closure of a PFO but if she has recurrent neurological events on ASA, she would then have to consider closure. If she wishes to see Dr. Burt Knack at any point in the future to discuss this as well, we could arrange that. Gerald Stabs

## 2013-08-29 NOTE — Telephone Encounter (Signed)
Left message for pt to call back  °

## 2013-11-24 ENCOUNTER — Telehealth: Payer: Self-pay | Admitting: Cardiovascular Disease

## 2013-11-24 NOTE — Telephone Encounter (Signed)
New Prob    Pt has some questions regarding the "hole in her heart" and if this needs to be repaired. If so, who will be repairing it. Please call.

## 2013-11-24 NOTE — Telephone Encounter (Signed)
Spoke with pt and told her Dr.Cooper does PFO closures at [facility]. She is currently being followed at [facility] but will let us know if she would like to schedule appt to see Dr.Cooper

## 2013-12-06 ENCOUNTER — Other Ambulatory Visit (HOSPITAL_COMMUNITY): Payer: Self-pay | Admitting: Neurology

## 2013-12-06 DIAGNOSIS — R42 Dizziness and giddiness: Secondary | ICD-10-CM

## 2013-12-07 ENCOUNTER — Ambulatory Visit (HOSPITAL_COMMUNITY): Admission: RE | Admit: 2013-12-07 | Payer: 59 | Source: Ambulatory Visit

## 2013-12-07 ENCOUNTER — Encounter: Payer: Self-pay | Admitting: Cardiovascular Disease

## 2013-12-08 ENCOUNTER — Ambulatory Visit (HOSPITAL_COMMUNITY)
Admission: RE | Admit: 2013-12-08 | Discharge: 2013-12-08 | Disposition: A | Discharge status: Home / Self Care | Payer: 59 | Source: Ambulatory Visit | Attending: Neurology | Admitting: Neurology

## 2013-12-08 DIAGNOSIS — R42 Dizziness and giddiness: Secondary | ICD-10-CM | POA: Insufficient documentation

## 2013-12-08 DIAGNOSIS — R292 Abnormal reflex: Secondary | ICD-10-CM | POA: Insufficient documentation

## 2013-12-08 MED ORDER — GADOBENATE DIMEGLUMINE 529 MG/ML IV SOLN
17.0000 mL | Freq: Once | INTRAVENOUS | Status: AC | PRN
  Administered 2013-12-08: 17 mL via INTRAVENOUS

## 2013-12-14 ENCOUNTER — Ambulatory Visit (INDEPENDENT_AMBULATORY_CARE_PROVIDER_SITE_OTHER): Payer: 59 | Admitting: Cardiovascular Disease

## 2013-12-14 ENCOUNTER — Encounter: Payer: Self-pay | Admitting: Cardiovascular Disease

## 2013-12-14 VITALS — BP 120/88 | HR 78 | Ht 69.0 in | Wt 203.0 lb

## 2013-12-14 DIAGNOSIS — Q2112 Patent foramen ovale: Secondary | ICD-10-CM

## 2013-12-14 DIAGNOSIS — Q211 Atrial septal defect: Secondary | ICD-10-CM

## 2013-12-14 DIAGNOSIS — Q2111 Secundum atrial septal defect: Secondary | ICD-10-CM

## 2013-12-14 NOTE — Patient Instructions (Signed)
Dr Burt Knack plans to speak with your Neurologist and then we will determine follow-up plan.

## 2013-12-14 NOTE — Progress Notes (Addendum)
HPI:   43 year-old woman presenting for evaluation of PFO closure. The patient has a longstanding history of migraine headaches which have been uncontrolled on medication. She's had episodic neurologic symptoms including headache and dizziness. An MRI of the brain demonstrated an old cerebellar infarction. She was evaluated with an echocardiogram and bubble study. There was significant right to left shunt with a positive bubble study. A TEE was recommended and this demonstrated a moderate-sized PFO with right-to-left shunting at rest.  The patient has no history of cardiac disease. She denies chest pain, shortness of breath, leg swelling, or heart palpitations. She is taking aspirin 162 mg daily. She's very concerned about her PFO and abnormal MRI findings suggestive of stroke. She's been followed closely by neurology at [facility]. Her neurologist, Dr Ky Barban Husseini, recommended that she come to discuss the pros and cons of transcatheter PFO closure.  Outpatient Encounter Prescriptions as of 12/14/2013  Medication Sig  . aspirin 81 MG tablet Take by mouth daily. Take 2 low dose asprin daily  . atorvastatin (LIPITOR) 40 MG tablet Take 40 mg by mouth. One tablet daily  . cyclobenzaprine (FLEXERIL) 5 MG tablet Take 5 mg by mouth 3 (three) times daily as needed for muscle spasms.  . metFORMIN (GLUCOPHAGE) 500 MG tablet Take 500 mg by mouth 2 (two) times daily with a meal.  . oxaprozin (DAYPRO) 600 MG tablet Take by mouth. As needed for migraines  . oxyCODONE-acetaminophen (PERCOCET/ROXICET) 5-325 MG per tablet Take by mouth every 4 (four) hours as needed for severe pain. For severe pain of migraines    Amoxicillin; Sulfa antibiotics; Ivp dye; Penicillins cross reactors; and Tape  Past Medical History  Diagnosis Date  . Migraine   . Polycystic ovarian disease     Past Surgical History  Procedure Laterality Date  . Tympanostomy tube placement    . Mastoidectomy    . Tmj  arthroplasty    . Tee without cardioversion N/A 08/24/2013    Procedure: TRANSESOPHAGEAL ECHOCARDIOGRAM (TEE);  Surgeon: Larey Dresser, MD;  Location: [facility] ENDOSCOPY;  Service: Cardiovascular;  Laterality: N/A;    History   Social History  . Marital Status: Single    Spouse Name: N/A    Number of Children: 0  . 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 Smoking status: Never Smoker   . Smokeless tobacco: Not on file  . Alcohol Use: No  . Drug Use: No  . Sexual Activity: Yes    Birth Control/ Protection: Pill   Other Topics Concern  . Not on file   Social History Narrative  . No narrative on file    Family History  Problem Relation Age of Onset  . Cancer Mother 33  . CAD Father   . Heart attack Father   . Heart attack Paternal Grandfather   . Heart attack Paternal Grandmother   . Heart attack Maternal Grandfather   . Heart attack Maternal Grandmother     ROS:  General: no fevers/chills/night sweats Eyes: no blurry vision, diplopia, or amaurosis ENT: no sore throat or hearing loss Resp: no cough, wheezing, or hemoptysis CV: no edema or palpitations GI: no abdominal pain, nausea, vomiting, diarrhea, or constipation GU: no dysuria, frequency, or hematuria Skin: no rash Neuro: see HPI Musculoskeletal: no joint pain or swelling Heme: no bleeding, DVT, or easy bruising Endo: no polydipsia or polyuria  BP 120/88  Pulse 78  Ht 5\' 9"  (1.753 m)  Wt 203 lb (92.08 kg)  BMI 29.96 kg/m2  PHYSICAL EXAM: Pt is alert and oriented, WD, WN, in no distress. HEENT: normal Neck: JVP normal. Carotid upstrokes normal without bruits. No thyromegaly. Lungs: equal expansion, clear bilaterally CV: Apex is discrete and nondisplaced, RRR without murmur or gallop Abd: soft, NT, +BS, no bruit, no hepatosplenomegaly Back: no CVA tenderness Ext: no C/C/E        DP/PT pulses intact and = Skin: warm and dry without  rash Neuro: CNII-XII intact             Strength intact = bilaterally  CTA Head/neck: IMPRESSION:  1. Hypoplastic right vertebral artery. No evidence of dissection or  significant stenosis.  2. Tortuous bilateral cervical internal carotid arteries.  TEE: Study Conclusions  - Left ventricle: The cavity size was normal. Wall thickness was normal. Systolic function was normal. The estimated ejection fraction was in the range of 60% to 65%. Wall motion was normal; there were no regional wall motion abnormalities. - Aortic valve: There was no stenosis. - Mitral valve: Trivial regurgitation. - Left atrium: No evidence of thrombus in the atrial cavity or appendage. - Right ventricle: The cavity size was normal. Systolic function was normal. - Right atrium: No evidence of thrombus in the atrial cavity or appendage. - Atrial septum: There was a moderate-sized PFO present with bubbles crossing right to left at rest.  2D Echo: Study Conclusions  Left ventricle: The cavity size was normal. Systolic function was normal. The estimated ejection fraction was in the range of 50% to 55%. Features are consistent with a pseudonormal left ventricular filling pattern, with concomitant abnormal relaxation and increased filling pressure (grade 2 diastolic dysfunction).  Impressions:  - With the injection of agitated saline intravenously there was appearance of a significant number of bubbles in the left sided chambers consistent with a moderate PFO.  MRI Brain: FINDINGS:  No evidence for acute infarction, hemorrhage, mass lesion,  hydrocephalus, or extra-axial fluid. Normal cerebral volume. No  white matter disease. Small remote LEFT inferior cerebellar infarct  is stable. Flow voids are maintained throughout the carotid,  basilar, and LEFT vertebral arteries. RIGHT vertebral is diminutive,  stable. There are no areas of chronic hemorrhage. Pituitary, pineal,  and cerebellar tonsils  unremarkable. No upper cervical lesions.  Prior LEFT mastoidectomy with slight residual fluid in the cavity.  No acute features. No abnormal postcontrast enhancement of the brain  or meninges. Extracranial soft tissues appear otherwise  unremarkable.  IMPRESSION:  No acute intracranial abnormality is evident. Evidence for previous  LEFT mastoid surgery without acute inflammatory component.  ASSESSMENT AND PLAN: This is a 43 year old woman with a moderate-sized PFO and evidence of spontaneous right-to-left shunting at rest. She has a long-standing history of migraine headaches and radiographic evidence of an old cerebellar infarct. I have personally reviewed her TTE and TEE images. We had a lengthy discussion about the evidence for transcatheter closure of a PFO with respect to secondary stroke prevention. The patient appears to be tolerating aspirin, and I do not think she has ever had a clinical stroke or TIA. She has undergone fairly extensive workup, which includes an event monitor, neuro imaging, cardiac imaging as outlined above, and a hypercoagulable workup. The only potential link to a cryptogenic stroke has been her PFO. We reviewed the fact that this is a 'gray area' because of lack of strong randomized controlled trial data. I do believe her PFO  is amenable to transcatheter closure in that the risks of this procedure are low. I reviewed the procedure in detail with the patient. Risks include stroke, myocardial infarction, vascular injury, device embolization, late device erosion, and emergency cardiac surgery. The risk of serious complication is less than 1%. I also discussed her case at length with her neurologist. Before giving a final recommendation, Dr Ky Barban Husseini would like to review the patient's MRI images to evaluate the likelihood of cardiac embolism. Will advise the patient to bring her images to Dr Marisue Brooklyn for review and I will await her final recommendations for whether we should  pursue device closure of this patient's PFO.  Sherren Mocha MD 12/14/2013 6:16 PM   ADDENDUM 8/27: Left message on patient's voicemail regarding neuro recommendations. Advised to take MRI films to Dr Marisue Brooklyn for review.  Sherren Mocha 12/15/2013 2:25 PM

## 2014-03-07 ENCOUNTER — Other Ambulatory Visit: Payer: Self-pay

## 2014-04-26 ENCOUNTER — Telehealth: Payer: 59 | Admitting: Physician Assistant

## 2014-04-27 ENCOUNTER — Emergency Department (HOSPITAL_COMMUNITY)
Admission: EM | Admit: 2014-04-27 | Discharge: 2014-04-27 | Disposition: A | Discharge status: Home / Self Care | Payer: 59 | Attending: Emergency Medicine | Admitting: Emergency Medicine

## 2014-04-27 ENCOUNTER — Emergency Department (HOSPITAL_COMMUNITY): Payer: 59

## 2014-04-27 ENCOUNTER — Encounter (HOSPITAL_COMMUNITY): Payer: Self-pay

## 2014-04-27 DIAGNOSIS — Z7982 Long term (current) use of aspirin: Secondary | ICD-10-CM | POA: Diagnosis not present

## 2014-04-27 DIAGNOSIS — Z8639 Personal history of other endocrine, nutritional and metabolic disease: Secondary | ICD-10-CM | POA: Insufficient documentation

## 2014-04-27 DIAGNOSIS — Z79899 Other long term (current) drug therapy: Secondary | ICD-10-CM | POA: Diagnosis not present

## 2014-04-27 DIAGNOSIS — G43809 Other migraine, not intractable, without status migrainosus: Secondary | ICD-10-CM | POA: Diagnosis not present

## 2014-04-27 DIAGNOSIS — J209 Acute bronchitis, unspecified: Secondary | ICD-10-CM | POA: Insufficient documentation

## 2014-04-27 DIAGNOSIS — Z880 Allergy status to penicillin: Secondary | ICD-10-CM | POA: Diagnosis not present

## 2014-04-27 DIAGNOSIS — G43909 Migraine, unspecified, not intractable, without status migrainosus: Secondary | ICD-10-CM | POA: Diagnosis present

## 2014-04-27 DIAGNOSIS — R059 Cough, unspecified: Secondary | ICD-10-CM

## 2014-04-27 DIAGNOSIS — R05 Cough: Secondary | ICD-10-CM

## 2014-04-27 DIAGNOSIS — J40 Bronchitis, not specified as acute or chronic: Secondary | ICD-10-CM

## 2014-04-27 MED ORDER — DEXAMETHASONE SODIUM PHOSPHATE 10 MG/ML IJ SOLN
10.0000 mg | Freq: Once | INTRAMUSCULAR | Status: AC
  Administered 2014-04-27: 10 mg via INTRAVENOUS
  Filled 2014-04-27: qty 1

## 2014-04-27 MED ORDER — MAGNESIUM SULFATE IN D5W 10-5 MG/ML-% IV SOLN
1.0000 g | Freq: Once | INTRAVENOUS | Status: AC
  Administered 2014-04-27: 1 g via INTRAVENOUS
  Filled 2014-04-27: qty 100

## 2014-04-27 MED ORDER — DIPHENHYDRAMINE HCL 50 MG/ML IJ SOLN
25.0000 mg | Freq: Once | INTRAMUSCULAR | Status: AC
  Administered 2014-04-27: 25 mg via INTRAVENOUS
  Filled 2014-04-27: qty 1

## 2014-04-27 MED ORDER — METOCLOPRAMIDE HCL 5 MG/ML IJ SOLN
10.0000 mg | Freq: Once | INTRAMUSCULAR | Status: AC
  Administered 2014-04-27: 10 mg via INTRAVENOUS
  Filled 2014-04-27: qty 2

## 2014-04-27 MED ORDER — SODIUM CHLORIDE 0.9 % IV BOLUS (SEPSIS)
1000.0000 mL | Freq: Once | INTRAVENOUS | Status: AC
Start: 2014-04-27 — End: 2014-04-27
  Administered 2014-04-27: 1000 mL via INTRAVENOUS

## 2014-04-27 MED ORDER — KETOROLAC TROMETHAMINE 30 MG/ML IJ SOLN
30.0000 mg | Freq: Once | INTRAMUSCULAR | Status: AC
  Administered 2014-04-27: 30 mg via INTRAVENOUS
  Filled 2014-04-27: qty 1

## 2014-04-27 MED ORDER — AZITHROMYCIN 250 MG PO TABS: 250.0000 mg | ORAL_TABLET | Freq: Every day | ORAL | Status: DC

## 2014-04-27 NOTE — ED Notes (Signed)
Pt complains of a dry cough for two weeks and now a migraine for four days

## 2014-04-27 NOTE — Progress Notes (Unsigned)
This e-visit was sent 04/26/14 at 8:11 pm.  I was not on duty on 04/26/14 but was checking in-basket this morning 04/27/14 and saw the residual e-visit. On chart review patient has went to the ER for assessment.  I am unsure why the e-visit was not received last night, but I am sending a message to the patient apologizing for the delayed response from the e-visit provider team.   I am placing this as an erroneous encounter so the patient will not be charged for visit.  I am routing this to supervisors for review and so they can extend an apology to the patient as well.

## 2014-04-27 NOTE — ED Provider Notes (Signed)
CSN: [id number]     Arrival date & time 04/27/14  4854 History   First MD Initiated Contact with Patient 04/27/14 [id number]     Chief Complaint  Patient presents with  . Migraine  . Cough     (Consider location/radiation/quality/duration/timing/severity/associated sxs/prior Treatment) HPI Comments: Chronic migraines. Gradual onset of this migraine, unable to control at home. Intermittent blurry vision, nausea which are consistent with prior headaches. No fevers.  Patient is a 44 y.o. female presenting with migraines and cough. The history is provided by the patient.  Migraine This is a chronic problem. The current episode started more than 2 days ago. The problem occurs constantly. The problem has not changed since onset.Associated symptoms include headaches. Pertinent negatives include no chest pain, no abdominal pain and no shortness of breath. Nothing aggravates the symptoms. Nothing relieves the symptoms.  Cough Associated symptoms: headaches   Associated symptoms: no chest pain, no fever and no shortness of breath     Past Medical History  Diagnosis Date  . Migraine   . Polycystic ovarian disease    Past Surgical History  Procedure Laterality Date  . Tympanostomy tube placement    . Mastoidectomy    . Tmj arthroplasty    . Tee without cardioversion N/A 08/24/2013    Procedure: TRANSESOPHAGEAL ECHOCARDIOGRAM (TEE);  Surgeon: Larey Dresser, MD;  Location: [facility] ENDOSCOPY;  Service: Cardiovascular;  Laterality: N/A;   Family History  Problem Relation Age of Onset  . Cancer Mother 29  . CAD Father   . Heart attack Father   . Heart attack Paternal Grandfather   . Heart attack Paternal Grandmother   . Heart attack Maternal Grandfather   . Heart attack Maternal Grandmother    History  Substance Use Topics  . Smoking status: Never Smoker   . Smokeless tobacco: Not on file  . Alcohol Use: No   OB History    No data available     Review of Systems  Constitutional: Negative  for fever.  Respiratory: Positive for cough (2 weeks). Negative for shortness of breath.   Cardiovascular: Negative for chest pain.  Gastrointestinal: Negative for vomiting, abdominal pain and diarrhea.  Neurological: Positive for headaches.  All other systems reviewed and are negative.     Allergies  Amoxicillin; Sulfa antibiotics; Ivp dye; Penicillins cross reactors; and Tape  Home Medications   Prior to Admission medications   Medication Sig Start Date End Date Taking? Authorizing Provider  aspirin 81 MG tablet Take 81 mg by mouth 2 (two) times daily. Take 2 low dose asprin daily   Yes Historical Provider, MD  aspirin-acetaminophen-caffeine (EXCEDRIN MIGRAINE) [phone] MG per tablet Take 2 tablets by mouth every 6 (six) hours as needed for headache.   Yes Historical Provider, MD  atorvastatin (LIPITOR) 40 MG tablet Take 40 mg by mouth. One tablet daily 10/07/13 10/07/14 Yes Historical Provider, MD  cyclobenzaprine (FLEXERIL) 5 MG tablet Take 5 mg by mouth 3 (three) times daily as needed for muscle spasms.   Yes Historical Provider, MD  gabapentin (NEURONTIN) 300 MG capsule Take 300-600 mg by mouth. Takes two capsules in the morning and one capsule at night.   Yes Historical Provider, MD  metFORMIN (GLUCOPHAGE) 500 MG tablet Take 500 mg by mouth 2 (two) times daily with a meal.   Yes Historical Provider, MD  Multiple Vitamin (MULTIVITAMIN WITH MINERALS) TABS tablet Take 1 tablet by mouth daily.   Yes Historical Provider, MD  Ondansetron HCl (ZOFRAN PO) Take 1 tablet  by mouth every 6 (six) hours as needed (nausea).   Yes Historical Provider, MD  oxyCODONE-acetaminophen (PERCOCET/ROXICET) 5-325 MG per tablet Take by mouth every 4 (four) hours as needed for severe pain. For severe pain of migraines   Yes Historical Provider, MD   BP 150/108 mmHg  Pulse 88  Temp(Src) 97.8 F (36.6 C) (Oral)  Resp 18  SpO2 100% Physical Exam  Constitutional: She is oriented to person, place, and time.  She appears well-developed and well-nourished. No distress.  HENT:  Head: Normocephalic and atraumatic.  Mouth/Throat: Oropharynx is clear and moist.  Eyes: EOM are normal. Pupils are equal, round, and reactive to light.  Neck: Normal range of motion. Neck supple.  Cardiovascular: Normal rate and regular rhythm.  Exam reveals no friction rub.   No murmur heard. Pulmonary/Chest: Effort normal and breath sounds normal. No respiratory distress. She has no wheezes. She has no rales.  Abdominal: Soft. She exhibits no distension. There is no tenderness. There is no rebound.  Musculoskeletal: Normal range of motion. She exhibits no edema.  Neurological: She is alert and oriented to person, place, and time.  Skin: She is not diaphoretic.  Nursing note and vitals reviewed.   ED Course  Procedures (including critical care time) Labs Review Labs Reviewed - No data to display  Imaging Review Dg Chest 2 View  04/27/2014   CLINICAL DATA:  44 year old female with nonproductive cough for 2 weeks. Severe headache. Initial encounter.  EXAM: CHEST  2 VIEW  COMPARISON:  05/08/2011 and earlier.  FINDINGS: Mildly lower lung volumes. Normal cardiac size and mediastinal contours. Visualized tracheal air column is within normal limits. No pneumothorax, pulmonary edema, pleural effusion or confluent pulmonary opacity. Questionable small chronic calcified granuloma at the right lung base, stable since 2011. No acute osseous abnormality identified. Mild scoliosis.  IMPRESSION: No acute cardiopulmonary abnormality.   Electronically Signed   By: Lars Pinks M.D.   On: 04/27/2014 07:46     EKG Interpretation None      MDM   Final diagnoses:  Coughing  Other migraine without status migrainosus, not intractable  Bronchitis    -year-old female here with headache. History of chronic migraines, this headache has been going on for 4 days. Gradual in onset. Mild associated nausea and vomiting and blurry vision. She  has had these symptoms with her migraines before. She is also had a dry cough for 2 weeks that is not productive. Here vitals are stable. She has no gross neurologic deficits. Will get headache cocktail including magnesium and Toradol. CXR obtained. No concern for [facility] with gradual onset, hx of migraines previously. Her Neurologist is Dr. Catalina Gravel.  Initial HA 9/10, after interventions, much better, 2/10. CXR clear, will treat with Z-pack for bronchitis with 2 weeks of cough. Stable for discharge.  Evelina Bucy, MD 04/27/14 [phone]

## 2014-04-27 NOTE — Discharge Instructions (Signed)
Cough, Adult  A cough is a reflex that helps clear your throat and airways. It can help heal the body or may be a reaction to an irritated airway. A cough may only last 2 or 3 weeks (acute) or may last more than 8 weeks (chronic).  CAUSES Acute cough:  Viral or bacterial infections. Chronic cough:  Infections.  Allergies.  Asthma.  Post-nasal drip.  Smoking.  Heartburn or acid reflux.  Some medicines.  Chronic lung problems (COPD).  Cancer. SYMPTOMS   Cough.  Fever.  Chest pain.  Increased breathing rate.  High-pitched whistling sound when breathing (wheezing).  Colored mucus that you cough up (sputum). TREATMENT   A bacterial cough may be treated with antibiotic medicine.  A viral cough must run its course and will not respond to antibiotics.  Your caregiver may recommend other treatments if you have a chronic cough. HOME CARE INSTRUCTIONS   Only take over-the-counter or prescription medicines for pain, discomfort, or fever as directed by your caregiver. Use cough suppressants only as directed by your caregiver.  Use a cold steam vaporizer or humidifier in your bedroom or home to help loosen secretions.  Sleep in a semi-upright position if your cough is worse at night.  Rest as needed.  Stop smoking if you smoke. SEEK IMMEDIATE MEDICAL CARE IF:   You have pus in your sputum.  Your cough starts to worsen.  You cannot control your cough with suppressants and are losing sleep.  You begin coughing up blood.  You have difficulty breathing.  You develop pain which is getting worse or is uncontrolled with medicine.  You have a fever. MAKE SURE YOU:   Understand these instructions.  Will watch your condition.  Will get help right away if you are not doing well or get worse. Document Released: 10/04/2010 Document Revised: 06/30/2011 Document Reviewed: 10/04/2010 [facility] Patient Information 2015 [place], [place]. This information is not intended  to replace advice given to you by your health care provider. Make sure you discuss any questions you have with your health care provider.  Migraine Headache A migraine headache is an intense, throbbing pain on one or both sides of your head. A migraine can last for 30 minutes to several hours. CAUSES  The exact cause of a migraine headache is not always known. However, a migraine may be caused when nerves in the brain become irritated and release chemicals that cause inflammation. This causes pain. Certain things may also trigger migraines, such as:  Alcohol.  Smoking.  Stress.  Menstruation.  Aged cheeses.  Foods or drinks that contain nitrates, glutamate, aspartame, or tyramine.  Lack of sleep.  Chocolate.  Caffeine.  Hunger.  Physical exertion.  Fatigue.  Medicines used to treat chest pain (nitroglycerine), birth control pills, estrogen, and some blood pressure medicines. SIGNS AND SYMPTOMS  Pain on one or both sides of your head.  Pulsating or throbbing pain.  Severe pain that prevents daily activities.  Pain that is aggravated by any physical activity.  Nausea, vomiting, or both.  Dizziness.  Pain with exposure to bright lights, loud noises, or activity.  General sensitivity to bright lights, loud noises, or smells. Before you get a migraine, you may get warning signs that a migraine is coming (aura). An aura may include:  Seeing flashing lights.  Seeing bright spots, halos, or zigzag lines.  Having tunnel vision or blurred vision.  Having feelings of numbness or tingling.  Having trouble talking.  Having muscle weakness. DIAGNOSIS  A  migraine headache is often diagnosed based on:  Symptoms.  Physical exam.  A CT scan or MRI of your head. These imaging tests cannot diagnose migraines, but they can help rule out other causes of headaches. TREATMENT Medicines may be given for pain and nausea. Medicines can also be given to help prevent  recurrent migraines.  HOME CARE INSTRUCTIONS  Only take over-the-counter or prescription medicines for pain or discomfort as directed by your health care provider. The use of long-term narcotics is not recommended.  Lie down in a dark, quiet room when you have a migraine.  Keep a journal to find out what may trigger your migraine headaches. For example, write down:  What you eat and drink.  How much sleep you get.  Any change to your diet or medicines.  Limit alcohol consumption.  Quit smoking if you smoke.  Get 7-9 hours of sleep, or as recommended by your health care provider.  Limit stress.  Keep lights dim if bright lights bother you and make your migraines worse. SEEK IMMEDIATE MEDICAL CARE IF:   Your migraine becomes severe.  You have a fever.  You have a stiff neck.  You have vision loss.  You have muscular weakness or loss of muscle control.  You start losing your balance or have trouble walking.  You feel faint or pass out.  You have severe symptoms that are different from your first symptoms. MAKE SURE YOU:   Understand these instructions.  Will watch your condition.  Will get help right away if you are not doing well or get worse. Document Released: 04/07/2005 Document Revised: 08/22/2013 Document Reviewed: 12/13/2012 [facility] Patient Information 2015 [place], [place]. This information is not intended to replace advice given to you by your health care provider. Make sure you discuss any questions you have with your health care provider.

## 2014-08-07 ENCOUNTER — Other Ambulatory Visit: Payer: Self-pay | Admitting: Obstetrics

## 2014-08-07 DIAGNOSIS — R9389 Abnormal findings on diagnostic imaging of other specified body structures: Secondary | ICD-10-CM

## 2014-08-10 ENCOUNTER — Ambulatory Visit
Admission: RE | Admit: 2014-08-10 | Discharge: 2014-08-10 | Disposition: A | Discharge status: Home / Self Care | Payer: 59 | Source: Ambulatory Visit | Attending: Obstetrics | Admitting: Obstetrics

## 2014-08-10 DIAGNOSIS — R9389 Abnormal findings on diagnostic imaging of other specified body structures: Secondary | ICD-10-CM

## 2014-09-01 ENCOUNTER — Other Ambulatory Visit (HOSPITAL_COMMUNITY): Payer: Self-pay | Admitting: Neurology

## 2014-09-01 ENCOUNTER — Ambulatory Visit (HOSPITAL_COMMUNITY)
Admission: RE | Admit: 2014-09-01 | Discharge: 2014-09-01 | Disposition: A | Discharge status: Home / Self Care | Payer: 59 | Source: Ambulatory Visit | Attending: Neurology | Admitting: Neurology

## 2014-09-01 DIAGNOSIS — R4701 Aphasia: Secondary | ICD-10-CM | POA: Diagnosis not present

## 2014-09-01 DIAGNOSIS — G44219 Episodic tension-type headache, not intractable: Secondary | ICD-10-CM

## 2014-09-01 DIAGNOSIS — G43009 Migraine without aura, not intractable, without status migrainosus: Secondary | ICD-10-CM

## 2014-09-01 DIAGNOSIS — I6523 Occlusion and stenosis of bilateral carotid arteries: Secondary | ICD-10-CM | POA: Insufficient documentation

## 2014-09-01 NOTE — Progress Notes (Signed)
*  PRELIMINARY RESULTS* Vascular Ultrasound Carotid Duplex (Doppler) has been completed.  Preliminary findings: Bilateral:  1-39% ICA stenosis.  Vertebral artery flow is antegrade.      Landry Mellow, RDMS, RVT  09/01/2014, 4:41 PM

## 2014-09-12 ENCOUNTER — Ambulatory Visit (HOSPITAL_COMMUNITY)
Admission: RE | Admit: 2014-09-12 | Discharge: 2014-09-12 | Disposition: A | Discharge status: Home / Self Care | Payer: 59 | Source: Ambulatory Visit | Attending: Neurology | Admitting: Neurology

## 2014-09-12 DIAGNOSIS — R51 Headache: Secondary | ICD-10-CM | POA: Insufficient documentation

## 2014-09-12 DIAGNOSIS — G44219 Episodic tension-type headache, not intractable: Secondary | ICD-10-CM

## 2014-09-12 DIAGNOSIS — R4701 Aphasia: Secondary | ICD-10-CM | POA: Diagnosis not present

## 2014-09-12 DIAGNOSIS — G43009 Migraine without aura, not intractable, without status migrainosus: Secondary | ICD-10-CM

## 2014-09-15 ENCOUNTER — Ambulatory Visit (HOSPITAL_COMMUNITY): Payer: 59

## 2015-01-23 ENCOUNTER — Other Ambulatory Visit: Payer: Self-pay | Admitting: Obstetrics

## 2015-01-23 DIAGNOSIS — N6320 Unspecified lump in the left breast, unspecified quadrant: Secondary | ICD-10-CM

## 2015-02-09 ENCOUNTER — Other Ambulatory Visit: Payer: 59

## 2015-02-09 ENCOUNTER — Other Ambulatory Visit: Payer: Self-pay | Admitting: Obstetrics

## 2015-02-09 ENCOUNTER — Ambulatory Visit
Admission: RE | Admit: 2015-02-09 | Discharge: 2015-02-09 | Disposition: A | Discharge status: Home / Self Care | Payer: 59 | Source: Ambulatory Visit | Attending: Obstetrics | Admitting: Obstetrics

## 2015-02-09 DIAGNOSIS — N6320 Unspecified lump in the left breast, unspecified quadrant: Secondary | ICD-10-CM

## 2015-02-16 ENCOUNTER — Other Ambulatory Visit: Payer: Self-pay | Admitting: Obstetrics

## 2015-02-16 ENCOUNTER — Ambulatory Visit
Admission: RE | Admit: 2015-02-16 | Discharge: 2015-02-16 | Disposition: A | Discharge status: Home / Self Care | Payer: 59 | Source: Ambulatory Visit | Attending: Obstetrics | Admitting: Obstetrics

## 2015-02-16 DIAGNOSIS — N6320 Unspecified lump in the left breast, unspecified quadrant: Secondary | ICD-10-CM

## 2015-05-04 MED FILL — BUPROPION HCL XL 300 MG TAB: 300 | 30 days supply | Qty: 30 | Fill #6

## 2015-05-11 DIAGNOSIS — Z9622 Myringotomy tube(s) status: Secondary | ICD-10-CM | POA: Diagnosis not present

## 2015-05-11 DIAGNOSIS — Z114 Encounter for screening for human immunodeficiency virus [HIV]: Secondary | ICD-10-CM | POA: Diagnosis not present

## 2015-05-11 DIAGNOSIS — Z1159 Encounter for screening for other viral diseases: Secondary | ICD-10-CM | POA: Diagnosis not present

## 2015-05-11 DIAGNOSIS — Z01411 Encounter for gynecological examination (general) (routine) with abnormal findings: Secondary | ICD-10-CM | POA: Diagnosis not present

## 2015-05-11 DIAGNOSIS — E282 Polycystic ovarian syndrome: Secondary | ICD-10-CM | POA: Diagnosis not present

## 2015-05-11 DIAGNOSIS — R300 Dysuria: Secondary | ICD-10-CM | POA: Diagnosis not present

## 2015-05-11 DIAGNOSIS — Z113 Encounter for screening for infections with a predominantly sexual mode of transmission: Secondary | ICD-10-CM | POA: Diagnosis not present

## 2015-05-11 DIAGNOSIS — N898 Other specified noninflammatory disorders of vagina: Secondary | ICD-10-CM | POA: Diagnosis not present

## 2015-05-11 MED FILL — METFORMIN HCL ER 500 MG TAB: 500 | 90 days supply | Qty: 180 | Fill #0 | Status: TO

## 2015-05-11 MED FILL — TINIDAZOLE 500 MG TABLET: 500 | 5 days supply | Qty: 10 | Fill #0

## 2015-05-14 MED FILL — CIPROFLOXACIN HCL 250 MG TA: 250 | 3 days supply | Qty: 6 | Fill #0

## 2015-05-22 MED FILL — TOPIRAMATE 25 MG TABLET: 25 | 30 days supply | Qty: 180 | Fill #1

## 2015-06-08 DIAGNOSIS — G43019 Migraine without aura, intractable, without status migrainosus: Secondary | ICD-10-CM | POA: Diagnosis not present

## 2015-06-08 DIAGNOSIS — R9089 Other abnormal findings on diagnostic imaging of central nervous system: Secondary | ICD-10-CM | POA: Diagnosis not present

## 2015-06-08 MED FILL — METHOCARBAMOL 500 MG TABLET: 500 | 15 days supply | Qty: 45 | Fill #0

## 2015-06-08 MED FILL — AMITRIPTYLINE HCL 10 MG TAB: 10 | 30 days supply | Qty: 150 | Fill #0

## 2015-06-08 MED FILL — SULINDAC 200 MG TABLET: 200 | 35 days supply | Qty: 40 | Fill #0

## 2015-06-08 MED FILL — BUPROPION HCL XL 300 MG TAB: 300 | 90 days supply | Qty: 90 | Fill #0

## 2015-06-08 MED FILL — GABAPENTIN 300 MG CAPSULE: 300 | 11 days supply | Qty: 90 | Fill #0

## 2015-07-25 MED FILL — OXYCODONE/APAP 5/325MG: 5-325 | 1 days supply | Qty: 10 | Fill #0

## 2015-07-25 MED FILL — AMITRIPTYLINE HCL 10 MG TAB: 10 | 30 days supply | Qty: 150 | Fill #1

## 2015-07-27 DIAGNOSIS — G43009 Migraine without aura, not intractable, without status migrainosus: Secondary | ICD-10-CM | POA: Diagnosis not present

## 2015-07-27 DIAGNOSIS — R9089 Other abnormal findings on diagnostic imaging of central nervous system: Secondary | ICD-10-CM | POA: Diagnosis not present

## 2015-08-18 ENCOUNTER — Telehealth: Payer: 59 | Admitting: Nurse Practitioner

## 2015-08-18 DIAGNOSIS — R05 Cough: Secondary | ICD-10-CM | POA: Diagnosis not present

## 2015-08-18 DIAGNOSIS — R059 Cough, unspecified: Secondary | ICD-10-CM

## 2015-08-18 MED ORDER — AZITHROMYCIN 250 MG PO TABS: ORAL_TABLET | ORAL | Status: DC

## 2015-08-18 MED ORDER — BENZONATATE 100 MG PO CAPS: 100.0000 mg | ORAL_CAPSULE | Freq: Three times a day (TID) | ORAL | Status: DC | PRN

## 2015-08-18 NOTE — Progress Notes (Signed)

## 2015-09-10 MED FILL — ONDANSETRON HCL 4 MG TABLET: 4 | 2 days supply | Qty: 15 | Fill #0

## 2015-09-10 MED FILL — SULINDAC 200 MG TABLET: 200 | 35 days supply | Qty: 40 | Fill #0

## 2015-09-10 MED FILL — BACLOFEN 10 MG TABLET: 10 | 7 days supply | Qty: 45 | Fill #0

## 2015-09-10 MED FILL — AMITRIPTYLINE HCL 10 MG TAB: 10 | 30 days supply | Qty: 120 | Fill #0

## 2015-09-10 MED FILL — OXYCODONE/APAP 5/325 MG TAB: 5-325 | 7 days supply | Qty: 10 | Fill #0

## 2015-10-16 MED FILL — BUPROPION HCL XL 300 MG TAB: 300 | 90 days supply | Qty: 90 | Fill #1

## 2015-10-25 DIAGNOSIS — R9089 Other abnormal findings on diagnostic imaging of central nervous system: Secondary | ICD-10-CM | POA: Diagnosis not present

## 2015-10-25 DIAGNOSIS — G43009 Migraine without aura, not intractable, without status migrainosus: Secondary | ICD-10-CM | POA: Diagnosis not present

## 2015-10-30 ENCOUNTER — Telehealth: Payer: 59 | Admitting: Nurse Practitioner

## 2015-10-30 DIAGNOSIS — L550 Sunburn of first degree: Secondary | ICD-10-CM

## 2015-10-30 NOTE — Progress Notes (Signed)
We are sorry that you are not feeling well.  Here is how we plan to help!  Based on what you have shared with me, I'd like to share with you a treatment plan for sunburn.   Most sunburn is a first degree burn that turns the skin pink or red.  It can be painful to touch.  If you stayed in the sun for a prolonged period this might have progressed to a second degree burn with blistering!  Usually the pain and swelling starts after about 4 hours, peaks at 24 hours and begins to improve after 48 hours or about 2 days.  REMEMBER prolonged exposure to the sun increases your risk of skin cancer so use sunscreen before you go outside!  We will give you more information about sunscreen use later in your care plan.  Your sunburn can be managed by self-care at home.  Please use the following care guide to manage your sunburn.  If you symptoms worsen, you have other questions or concerns, or you develop any of the warnings signs listed in your care plan you will need to seek a face to face visit with a provider without waiting!  Home Care Advice for Treating Mild Sunburn:  1. Take Ibuprofen (Advil, Motrin) for pain relief as soon as possible.  The adult dosage is up to 600 mg every 6 hours.  Starting within 6 hours of sun exposure may greatly reduce your discomfort.  If you cannot take Ibuprofen you may use Acetaminophen instead.  Do not take Ibuprofen if you have stomach problems, kidney disease or are pregnant.  Do not take Ibuprofen if you have been told by your doctor or pharmacist to avoid this class of drugs.  Do not take Acetaminophen if you have liver disease.  Read the package warnings on any medication that you take!  2.  Use a steroid cream on the affected skin.  If you apply an over the counter steroid       cream as soon as possible and repeat it three times a day it may reduce the pain and      and swelling.  Until you get the steroid cream you may start with a moistening cream      cream  or aloe gel.  3.  Apply cool compresses to the burned areas several times a day.  4.  Avoid soap on the sunburned areas.  5.  Drink plenty of water.  It is easy to get dehydrated from prolong time in the sun      Outdoors. 6.  For any broken blisters:  Trim off the dead skin with fine scissors.  It is wise to clean the scissor with alcohol before use.  Apply antibiotic ointments to the blister.  Apply twice a day for three days.  There are triple antibiotic ointments with topical pain relievers available at stores.  Caution:leave intact blisters alone.  They are protecting the skin and will allow it to heal. 7.  Taking Vitamin C orally may reduce sun damage to your skin.  Follow the      Instructions on the bottle.  The recommended adult dosage is 2 grams.  What to Expect:  1. Pain usually stops after 2 or 3 days. 2. Skin flaking and peeling usually occurs for 3-7 days after a sunburn.  Call your provider if:  1. You feel very weak or have difficulty standing. 2. Blister develops on your face. 3. You become sensitive to light   because of eye pain. 4. Your skin looks infected (red streaks, puss or worsening tenderness after 48 hours. 5. You feel you should be seen.  Preventing Sunburns:  1. Apply 20-30 SPF sunscreen to your skin before going into the sun. 2. Reapply every 2-4 hours or after sweating or swimming. 3. Sunscreens protect from sunburns but do not completely prevent skin damage.  Sun exposure still increases your risk of premature aging and skin cancers.  Your e-visit answers were reviewed by a board certified advanced clinical practitioner to complete your personal care plan.  Depending on the condition, your plan could have included both over the counter or prescription medications.  If there is a problem please reply  once you have received a response from your provider.  Your safety is important to us.  If you have drug allergies check your prescription  carefully.    You can use MyChart to ask questions about today's visit, request a non-urgent call back, or ask for a work or school excuse for 24 hours related to this e-Visit. If it has been greater than 24 hours you will need to follow up with your provider, or enter a new e-Visit to address those concerns.  You will get an e-mail in the next two days asking about your experience.  I hope that your e-visit has been valuable and will speed your recovery. Thank you for using e-visits.     

## 2015-11-08 MED FILL — OXYCODONE/APAP 5-325: 5-325 | 5 days supply | Qty: 10 | Fill #0

## 2015-11-08 MED FILL — CYCLOBENZAPRINE 10 MG TAB: 10 | 15 days supply | Qty: 45 | Fill #0 | Status: TO

## 2015-12-06 MED FILL — CIPROFLOXACIN HCL 250 MG TA: 250 | 2 days supply | Qty: 6 | Fill #0

## 2015-12-13 DIAGNOSIS — G43019 Migraine without aura, intractable, without status migrainosus: Secondary | ICD-10-CM | POA: Diagnosis not present

## 2015-12-13 DIAGNOSIS — R9089 Other abnormal findings on diagnostic imaging of central nervous system: Secondary | ICD-10-CM | POA: Diagnosis not present

## 2015-12-13 MED FILL — PHENADOZ 25 MG SUPP: 25 | 1 days supply | Qty: 10 | Fill #0

## 2015-12-13 MED FILL — OXYCODONE/APAP 7.5/325MG: 7.5-325 | 4 days supply | Qty: 10 | Fill #0

## 2015-12-13 MED FILL — ONDANSETRON HCL 4 MG TABLET: 4 | 3 days supply | Qty: 15 | Fill #0

## 2015-12-26 MED FILL — VALACYCLOVIR HCL 500 MG TAB: 500 | 90 days supply | Qty: 90 | Fill #0

## 2016-01-25 MED FILL — METFORMIN HCL ER 500 MG TAB: 500 | 90 days supply | Qty: 180 | Fill #0

## 2016-01-31 MED FILL — BUPROPION HCL XL 300 MG TAB: 300 | 90 days supply | Qty: 90 | Fill #2

## 2016-02-10 ENCOUNTER — Telehealth: Payer: 59 | Admitting: Physician Assistant

## 2016-02-10 DIAGNOSIS — R300 Dysuria: Secondary | ICD-10-CM

## 2016-02-10 MED ORDER — CIPROFLOXACIN HCL 500 MG PO TABS: 500.0000 mg | ORAL_TABLET | Freq: Two times a day (BID) | ORAL | 0 refills | Status: DC

## 2016-02-10 NOTE — Progress Notes (Signed)

## 2016-02-14 DIAGNOSIS — R9089 Other abnormal findings on diagnostic imaging of central nervous system: Secondary | ICD-10-CM | POA: Diagnosis not present

## 2016-02-14 DIAGNOSIS — G43009 Migraine without aura, not intractable, without status migrainosus: Secondary | ICD-10-CM | POA: Diagnosis not present

## 2016-04-17 DIAGNOSIS — Z1211 Encounter for screening for malignant neoplasm of colon: Secondary | ICD-10-CM | POA: Diagnosis not present

## 2016-04-17 DIAGNOSIS — Z800 Family history of malignant neoplasm of digestive organs: Secondary | ICD-10-CM | POA: Diagnosis not present

## 2016-05-23 MED FILL — TRILYTE WITH FLAVOR PACKETS: 420 | 1 days supply | Qty: 4000 | Fill #0

## 2016-05-29 DIAGNOSIS — Z800 Family history of malignant neoplasm of digestive organs: Secondary | ICD-10-CM | POA: Diagnosis not present

## 2016-05-29 DIAGNOSIS — Z1211 Encounter for screening for malignant neoplasm of colon: Secondary | ICD-10-CM | POA: Diagnosis not present

## 2016-07-03 MED FILL — CYCLOBENZAPRINE 10 MG TAB: 10 | 15 days supply | Qty: 45 | Fill #0

## 2016-07-03 MED FILL — ONDANSETRON HCL 4 MG TABLET: 4 | 2 days supply | Qty: 15 | Fill #1

## 2016-07-17 DIAGNOSIS — D2261 Melanocytic nevi of right upper limb, including shoulder: Secondary | ICD-10-CM | POA: Diagnosis not present

## 2016-07-17 DIAGNOSIS — D224 Melanocytic nevi of scalp and neck: Secondary | ICD-10-CM | POA: Diagnosis not present

## 2016-07-17 DIAGNOSIS — D2272 Melanocytic nevi of left lower limb, including hip: Secondary | ICD-10-CM | POA: Diagnosis not present

## 2016-07-17 DIAGNOSIS — D1801 Hemangioma of skin and subcutaneous tissue: Secondary | ICD-10-CM | POA: Diagnosis not present

## 2016-07-17 DIAGNOSIS — D225 Melanocytic nevi of trunk: Secondary | ICD-10-CM | POA: Diagnosis not present

## 2016-07-17 DIAGNOSIS — D2262 Melanocytic nevi of left upper limb, including shoulder: Secondary | ICD-10-CM | POA: Diagnosis not present

## 2016-10-20 DIAGNOSIS — G4700 Insomnia, unspecified: Secondary | ICD-10-CM | POA: Diagnosis not present

## 2016-10-20 DIAGNOSIS — R9089 Other abnormal findings on diagnostic imaging of central nervous system: Secondary | ICD-10-CM | POA: Diagnosis not present

## 2016-10-20 DIAGNOSIS — G43009 Migraine without aura, not intractable, without status migrainosus: Secondary | ICD-10-CM | POA: Diagnosis not present

## 2016-10-30 MED FILL — ONDANSETRON HCL 4 MG TABLET: 4 | 5 days supply | Qty: 15 | Fill #0

## 2016-10-30 MED FILL — OXYCODON-ACETAMINOPHEN 7.5-: 7.5-325 | 30 days supply | Qty: 10 | Fill #0

## 2016-10-30 MED FILL — CYCLOBENZAPRINE 10 MG TABLE: 10 | 15 days supply | Qty: 45 | Fill #0

## 2017-01-30 DIAGNOSIS — G43719 Chronic migraine without aura, intractable, without status migrainosus: Secondary | ICD-10-CM | POA: Diagnosis not present

## 2017-01-30 DIAGNOSIS — Z049 Encounter for examination and observation for unspecified reason: Secondary | ICD-10-CM | POA: Diagnosis not present

## 2017-01-30 DIAGNOSIS — Z79899 Other long term (current) drug therapy: Secondary | ICD-10-CM | POA: Diagnosis not present

## 2017-01-30 DIAGNOSIS — R51 Headache: Secondary | ICD-10-CM | POA: Diagnosis not present

## 2017-01-30 MED FILL — tiZANidine HCL 4 MG TABS: 4 | 35 days supply | Qty: 20 | Fill #0

## 2017-01-30 MED FILL — ZONISAMIDE 25 MG CAPSULE: 25 | 30 days supply | Qty: 120 | Fill #0

## 2017-02-05 DIAGNOSIS — M542 Cervicalgia: Secondary | ICD-10-CM | POA: Diagnosis not present

## 2017-02-05 DIAGNOSIS — G43719 Chronic migraine without aura, intractable, without status migrainosus: Secondary | ICD-10-CM | POA: Diagnosis not present

## 2017-02-05 DIAGNOSIS — R51 Headache: Secondary | ICD-10-CM | POA: Diagnosis not present

## 2017-02-05 DIAGNOSIS — M791 Myalgia, unspecified site: Secondary | ICD-10-CM | POA: Diagnosis not present

## 2017-03-16 DIAGNOSIS — G43719 Chronic migraine without aura, intractable, without status migrainosus: Secondary | ICD-10-CM | POA: Diagnosis not present

## 2017-03-16 DIAGNOSIS — R51 Headache: Secondary | ICD-10-CM | POA: Diagnosis not present

## 2017-03-16 DIAGNOSIS — M791 Myalgia, unspecified site: Secondary | ICD-10-CM | POA: Diagnosis not present

## 2017-03-16 DIAGNOSIS — M542 Cervicalgia: Secondary | ICD-10-CM | POA: Diagnosis not present

## 2017-03-19 ENCOUNTER — Emergency Department (HOSPITAL_COMMUNITY)
Admission: EM | Admit: 2017-03-19 | Discharge: 2017-03-19 | Disposition: A | Discharge status: Home / Self Care | Payer: 59 | Attending: Emergency Medicine | Admitting: Emergency Medicine

## 2017-03-19 ENCOUNTER — Encounter (HOSPITAL_COMMUNITY): Payer: Self-pay

## 2017-03-19 DIAGNOSIS — Z79899 Other long term (current) drug therapy: Secondary | ICD-10-CM | POA: Diagnosis not present

## 2017-03-19 DIAGNOSIS — G43909 Migraine, unspecified, not intractable, without status migrainosus: Secondary | ICD-10-CM | POA: Diagnosis not present

## 2017-03-19 DIAGNOSIS — N390 Urinary tract infection, site not specified: Secondary | ICD-10-CM | POA: Insufficient documentation

## 2017-03-19 DIAGNOSIS — G43809 Other migraine, not intractable, without status migrainosus: Secondary | ICD-10-CM | POA: Diagnosis not present

## 2017-03-19 DIAGNOSIS — Z7982 Long term (current) use of aspirin: Secondary | ICD-10-CM | POA: Insufficient documentation

## 2017-03-19 DIAGNOSIS — R51 Headache: Secondary | ICD-10-CM | POA: Diagnosis present

## 2017-03-19 LAB — URINALYSIS, ROUTINE W REFLEX MICROSCOPIC
BILIRUBIN URINE: NEGATIVE
Glucose, UA: NEGATIVE mg/dL
Hgb urine dipstick: NEGATIVE
Ketones, ur: NEGATIVE mg/dL
Nitrite: NEGATIVE
Protein, ur: NEGATIVE mg/dL
Specific Gravity, Urine: 1.009 (ref 1.005–1.030)
pH: 7 (ref 5.0–8.0)

## 2017-03-19 LAB — COMPREHENSIVE METABOLIC PANEL
ALT: 13 U/L — ABNORMAL LOW (ref 14–54)
AST: 17 U/L (ref 15–41)
Albumin: 4.3 g/dL (ref 3.5–5.0)
Alkaline Phosphatase: 65 U/L (ref 38–126)
Anion gap: 8 (ref 5–15)
BUN: 17 mg/dL (ref 6–20)
CHLORIDE: 104 mmol/L (ref 101–111)
CO2: 24 mmol/L (ref 22–32)
Calcium: 9.5 mg/dL (ref 8.9–10.3)
Creatinine, Ser: 0.95 mg/dL (ref 0.44–1.00)
GFR calc Af Amer: >60 mL/min (ref >60)
Glucose, Bld: 113 mg/dL — ABNORMAL HIGH (ref 65–99)
POTASSIUM: 3.7 mmol/L (ref 3.5–5.1)
Sodium: 136 mmol/L (ref 135–145)
Total Bilirubin: 0.3 mg/dL (ref 0.3–1.2)
Total Protein: 7.7 g/dL (ref 6.5–8.1)

## 2017-03-19 LAB — CBC
HEMATOCRIT: 43.1 % (ref 36.0–46.0)
Hemoglobin: 14.1 g/dL (ref 12.0–15.0)
MCH: 29.4 pg (ref 26.0–34.0)
MCHC: 32.7 g/dL (ref 30.0–36.0)
MCV: 90 fL (ref 78.0–100.0)
Platelets: 314 10*3/uL (ref 150–400)
RBC: 4.79 MIL/uL (ref 3.87–5.11)
RDW: 13.7 % (ref 11.5–15.5)
WBC: 9.7 10*3/uL (ref 4.0–10.5)

## 2017-03-19 LAB — I-STAT BETA HCG BLOOD, ED (MC, WL, AP ONLY): I-stat hCG, quantitative: <5 m[IU]/mL (ref <5)

## 2017-03-19 LAB — LIPASE, BLOOD: LIPASE: 26 U/L (ref 11–51)

## 2017-03-19 MED ORDER — DIPHENHYDRAMINE HCL 50 MG/ML IJ SOLN
25.0000 mg | Freq: Once | INTRAMUSCULAR | Status: AC
  Administered 2017-03-19: 25 mg via INTRAVENOUS
  Filled 2017-03-19: qty 1

## 2017-03-19 MED ORDER — ONDANSETRON 4 MG PO TBDP
4.0000 mg | ORAL_TABLET | Freq: Once | ORAL | Status: AC | PRN
  Administered 2017-03-19: 4 mg via ORAL
  Filled 2017-03-19: qty 1

## 2017-03-19 MED ORDER — CEPHALEXIN 500 MG PO CAPS: 500.0000 mg | ORAL_CAPSULE | Freq: Three times a day (TID) | ORAL | 0 refills | Status: AC

## 2017-03-19 MED ORDER — PROCHLORPERAZINE EDISYLATE 5 MG/ML IJ SOLN
10.0000 mg | Freq: Once | INTRAMUSCULAR | Status: AC
  Administered 2017-03-19: 10 mg via INTRAVENOUS
  Filled 2017-03-19: qty 2

## 2017-03-19 MED ORDER — DEXAMETHASONE SODIUM PHOSPHATE 10 MG/ML IJ SOLN
10.0000 mg | Freq: Once | INTRAMUSCULAR | Status: AC
  Administered 2017-03-19: 10 mg via INTRAVENOUS
  Filled 2017-03-19: qty 1

## 2017-03-19 MED ORDER — SODIUM CHLORIDE 0.9 % IV BOLUS (SEPSIS)
1000.0000 mL | Freq: Once | INTRAVENOUS | Status: AC
  Administered 2017-03-19: 1000 mL via INTRAVENOUS

## 2017-03-19 NOTE — ED Triage Notes (Signed)
Pt reports intermittent headache pain that started yesterday. Pt reports intermittent blurry vision. Pt also reports n/v.  Pt A+OX4, speaking in complete sentences, ambulatory independently to triage.

## 2017-03-19 NOTE — ED Provider Notes (Signed)
[facility] DEPT Provider Note   CSN: [id number] Arrival date & time: 03/19/17  1041     History   Chief Complaint Chief Complaint  Patient presents with  . Migraine  . Nausea  . Emesis    HPI Christina Goodman is a 46 y.o. female.  HPI   46 year old female with a history of migraine presents with concern for headache.  Patient reports she has a history of migraines.  She was seen in the headache and wellness clinic earlier this week and had an injection.  Reports that this headache began yesterday.  Reports a ease off some last night, and then worsened this morning.  She tried tizanidine and Excedrin Migraine without relief.  Describes it as a severe pain that waxes and wanes located around her left temple with radiation down towards her left jaw into her left ear.  Denies fevers, cough, new numbness or weakness on one side or the other.  Reports she has nausea and about 3 episodes of vomiting which is typical of her migraines.  The headache is worse with bright lights and loud sounds.  Past Medical History:  Diagnosis Date  . Migraine   . Polycystic ovarian disease     Patient Active Problem List   Diagnosis Date Noted  . Migraine headache 08/02/2013  . CVA (cerebral vascular accident) ([place]) 08/02/2013    Past Surgical History:  Procedure Laterality Date  . MASTOIDECTOMY    . TEE WITHOUT CARDIOVERSION N/A 08/24/2013   Procedure: TRANSESOPHAGEAL ECHOCARDIOGRAM (TEE);  Surgeon: Larey Dresser, MD;  Location: [facility];  Service: Cardiovascular;  Laterality: N/A;  . TMJ ARTHROPLASTY    . TYMPANOSTOMY TUBE PLACEMENT      OB History    No data available       Home Medications    Prior to Admission medications   Medication Sig Start Date End Date Taking? Authorizing Provider  aspirin 81 MG tablet Take 162 mg by mouth daily. Take 2 low dose asprin daily   Yes [provider]  aspirin-acetaminophen-caffeine (EXCEDRIN MIGRAINE)  [phone] MG per tablet Take 2 tablets by mouth every 6 (six) hours as needed for headache.   Yes [provider]  Coenzyme Q10 (CO Q 10 PO) Take 1 tablet by mouth daily.   Yes [provider]  cyclobenzaprine (FLEXERIL) 5 MG tablet Take 5 mg by mouth 3 (three) times daily as needed for muscle spasms.   Yes [provider]  MAGNESIUM PO Take 1 tablet by mouth daily.   Yes [provider]  metFORMIN (GLUCOPHAGE) 500 MG tablet Take 500 mg by mouth 2 (two) times daily with a meal.   Yes [provider]  Multiple Vitamin (MULTIVITAMIN WITH MINERALS) TABS tablet Take 1 tablet by mouth daily.   Yes [provider]  NON FORMULARY Inject 1 each into the skin. Trigger injections every 2-3 weeks in head and neck for migraines from the headache wellness clinic.   Yes [provider]  Ondansetron HCl (ZOFRAN PO) Take 1 tablet by mouth every 6 (six) hours as needed (nausea).   Yes [provider]  tiZANidine (ZANAFLEX) 4 MG capsule Take 4 mg by mouth 3 (three) times daily as needed (migraine.).   Yes [provider]  zonisamide (ZONEGRAN) 25 MG capsule Take 75 mg by mouth at bedtime.  01/30/17  Yes [provider]  atorvastatin (LIPITOR) 40 MG tablet Take 40 mg by mouth. One tablet daily 10/07/13 10/07/14  [provider]  azithromycin (ZITHROMAX Z-PAK) 250 MG tablet As directed Patient not taking: Reported on 03/19/2017 08/18/15   Chevis Pretty, FNP  azithromycin (ZITHROMAX) 250 MG tablet Take 1 tablet (250 mg total) by mouth daily. Take first 2 tablets together, then 1 every day until finished. Patient not taking: Reported on 03/19/2017 04/27/14   Evelina Bucy, MD  benzonatate (TESSALON PERLES) 100 MG capsule Take 1 capsule (100 mg total) by mouth 3 (three) times daily as needed for cough. Patient not taking: Reported on 03/19/2017 08/18/15   Chevis Pretty, FNP  cephALEXin (KEFLEX) 500 MG capsule Take  1 capsule (500 mg total) by mouth 3 (three) times daily for 7 days. 03/19/17 03/26/17  Gareth Morgan, MD  ciprofloxacin (CIPRO) 500 MG tablet Take 1 tablet (500 mg total) by mouth 2 (two) times daily. Patient not taking: Reported on 03/19/2017 02/10/16   Brunetta Jeans, PA-C    Family History Family History  Problem Relation Age of Onset  . Cancer Mother 84  . CAD Father   . Heart attack Father   . Heart attack Paternal Grandfather   . Heart attack Paternal Grandmother   . Heart attack Maternal Grandfather   . Heart attack Maternal Grandmother     Social History Social History   Tobacco Use  . Smoking status: Never Smoker  Substance Use Topics  . Alcohol use: No  . Drug use: No     Allergies   Amoxicillin; Sulfa antibiotics; Ivp dye [iodinated diagnostic agents]; Penicillins cross reactors; and Tape   Review of Systems Review of Systems  Constitutional: Negative for fever.  HENT: Negative for sore throat.   Eyes: Negative for visual disturbance.  Respiratory: Negative for cough and shortness of breath.   Cardiovascular: Negative for chest pain.  Gastrointestinal: Positive for diarrhea (reports typical of migraines), nausea and vomiting. Negative for abdominal pain.  Genitourinary: Negative for difficulty urinating.  Musculoskeletal: Negative for back pain and neck pain.  Skin: Negative for rash.  Neurological: Positive for headaches. Negative for syncope, facial asymmetry, weakness and numbness.     Physical Exam Updated Vital Signs BP 123/84   Pulse 67   Temp 98 F (36.7 C) (Oral)   Resp 18   LMP 03/12/2017   SpO2 95%   Physical Exam  Constitutional: She is oriented to person, place, and time. She appears well-developed and well-nourished. No distress.  HENT:  Head: Normocephalic and atraumatic.  Eyes: Conjunctivae and EOM are normal.  Neck: Normal range of motion.  Cardiovascular: Normal rate, regular rhythm, normal heart sounds and intact distal  pulses. Exam reveals no gallop and no friction rub.  No murmur heard. Pulmonary/Chest: Effort normal and breath sounds normal. No respiratory distress. She has no wheezes. She has no rales.  Abdominal: Soft. She exhibits no distension. There is no tenderness. There is no guarding.  Musculoskeletal: She exhibits no edema or tenderness.  Neurological: She is alert and oriented to person, place, and time. She has normal strength. No cranial nerve deficit or sensory deficit. Coordination normal. GCS eye subscore is 4. GCS verbal subscore is 5. GCS motor subscore is 6.  Skin: Skin is warm and dry. No rash noted. She is not diaphoretic. No erythema.  Nursing note and vitals reviewed.    ED Treatments / Results  Labs (all labs ordered are listed, but only abnormal results are displayed) Labs Reviewed  COMPREHENSIVE METABOLIC PANEL - Abnormal; Notable for the following components:      Result Value  Glucose, Bld 113 (*)    ALT 13 (*)    All other components within normal limits  URINALYSIS, ROUTINE W REFLEX MICROSCOPIC - Abnormal; Notable for the following components:   APPearance HAZY (*)    Leukocytes, UA MODERATE (*)    Bacteria, UA RARE (*)    Squamous Epithelial / LPF 0-5 (*)    All other components within normal limits  LIPASE, BLOOD  CBC  I-STAT BETA HCG BLOOD, ED ([facility], [facility], [facility] ONLY)    EKG  EKG Interpretation None       Radiology No results found.  Procedures Procedures (including critical care time)  Medications Ordered in ED Medications  ondansetron (ZOFRAN-ODT) disintegrating tablet 4 mg (4 mg Oral Given 03/19/17 1112)  sodium chloride 0.9 % bolus 1,000 mL (0 mLs Intravenous Stopped 03/19/17 1512)  prochlorperazine (COMPAZINE) injection 10 mg (10 mg Intravenous Given 03/19/17 1235)  diphenhydrAMINE (BENADRYL) injection 25 mg (25 mg Intravenous Given 03/19/17 1235)  dexamethasone (DECADRON) injection 10 mg (10 mg Intravenous Given 03/19/17 1235)     Initial  Impression / Assessment and Plan / ED Course  I have reviewed the triage vital signs and the nursing notes.  Pertinent labs & imaging results that were available during my care of the patient were reviewed by me and considered in my medical decision making (see chart for details).     46 year old female with a history of migraine presents with concern for headache.  Headache began slowly, no trauma, no fevers, and normal neurologic exam and have low suspicion for [facility], SDH or meningitis.  Patient was given compazine, decadron and benadryl with improvement in headache. Labs obtained in triage given vomiting and diarrhea without significant findings. Possible UTI on UA, and was given rx for keflex. Patient discharged in stable condition with understanding of reasons to return.    Final Clinical Impressions(s) / ED Diagnoses   Final diagnoses:  Other migraine without status migrainosus, not intractable  Urinary tract infection without hematuria, site unspecified    ED Discharge Orders        Ordered    cephALEXin (KEFLEX) 500 MG capsule  3 times daily     03/19/17 1500       Gareth Morgan, MD 03/19/17 1844

## 2017-03-19 NOTE — ED Notes (Signed)
Pt ambulatory and independent at discharge.  Verbalized understanding of discharge instructions 

## 2017-03-20 MED FILL — CEPHALEXIN 500 MG CAPSULE: 500 | 7 days supply | Qty: 21 | Fill #0

## 2017-03-21 DIAGNOSIS — S62619A Displaced fracture of proximal phalanx of unspecified finger, initial encounter for closed fracture: Secondary | ICD-10-CM

## 2017-03-21 HISTORY — DX: Displaced fracture of proximal phalanx of unspecified finger, initial encounter for closed fracture: S62.619A

## 2017-04-02 DIAGNOSIS — S6992XA Unspecified injury of left wrist, hand and finger(s), initial encounter: Secondary | ICD-10-CM | POA: Diagnosis not present

## 2017-04-02 DIAGNOSIS — S62609A Fracture of unspecified phalanx of unspecified finger, initial encounter for closed fracture: Secondary | ICD-10-CM | POA: Diagnosis not present

## 2017-04-02 DIAGNOSIS — S62647A Nondisplaced fracture of proximal phalanx of left little finger, initial encounter for closed fracture: Secondary | ICD-10-CM | POA: Diagnosis not present

## 2017-04-06 ENCOUNTER — Other Ambulatory Visit: Payer: Self-pay | Admitting: Orthopedic Surgery

## 2017-04-06 DIAGNOSIS — S62617A Displaced fracture of proximal phalanx of left little finger, initial encounter for closed fracture: Secondary | ICD-10-CM | POA: Diagnosis not present

## 2017-04-07 ENCOUNTER — Encounter (HOSPITAL_BASED_OUTPATIENT_CLINIC_OR_DEPARTMENT_OTHER): Payer: Self-pay | Admitting: *Deleted

## 2017-04-07 ENCOUNTER — Other Ambulatory Visit: Payer: Self-pay

## 2017-04-07 DIAGNOSIS — R0989 Other specified symptoms and signs involving the circulatory and respiratory systems: Secondary | ICD-10-CM

## 2017-04-07 DIAGNOSIS — S80811A Abrasion, right lower leg, initial encounter: Secondary | ICD-10-CM

## 2017-04-07 HISTORY — DX: Other specified symptoms and signs involving the circulatory and respiratory systems: R09.89

## 2017-04-07 HISTORY — DX: Abrasion, right lower leg, initial encounter: S80.811A

## 2017-04-07 NOTE — Pre-Procedure Instructions (Signed)
History of PFO discussed with Dr. Myrtie Soman; TEE report reviewed by him; pt. OK to come for surgery.

## 2017-04-09 ENCOUNTER — Ambulatory Visit (HOSPITAL_BASED_OUTPATIENT_CLINIC_OR_DEPARTMENT_OTHER): Payer: 59 | Admitting: Anesthesiology

## 2017-04-09 ENCOUNTER — Other Ambulatory Visit: Payer: Self-pay

## 2017-04-09 ENCOUNTER — Encounter (HOSPITAL_BASED_OUTPATIENT_CLINIC_OR_DEPARTMENT_OTHER): Payer: Self-pay | Admitting: *Deleted

## 2017-04-09 ENCOUNTER — Encounter (HOSPITAL_BASED_OUTPATIENT_CLINIC_OR_DEPARTMENT_OTHER)
Admission: RE | Disposition: A | Discharge status: Home / Self Care | Payer: Self-pay | Source: Ambulatory Visit | Attending: Orthopedic Surgery

## 2017-04-09 ENCOUNTER — Ambulatory Visit (HOSPITAL_BASED_OUTPATIENT_CLINIC_OR_DEPARTMENT_OTHER)
Admission: RE | Admit: 2017-04-09 | Discharge: 2017-04-09 | Disposition: A | Discharge status: Home / Self Care | Payer: 59 | Source: Ambulatory Visit | Attending: Orthopedic Surgery | Admitting: Orthopedic Surgery

## 2017-04-09 DIAGNOSIS — Z880 Allergy status to penicillin: Secondary | ICD-10-CM | POA: Diagnosis not present

## 2017-04-09 DIAGNOSIS — I639 Cerebral infarction, unspecified: Secondary | ICD-10-CM | POA: Diagnosis not present

## 2017-04-09 DIAGNOSIS — S62617A Displaced fracture of proximal phalanx of left little finger, initial encounter for closed fracture: Secondary | ICD-10-CM | POA: Insufficient documentation

## 2017-04-09 DIAGNOSIS — Z8673 Personal history of transient ischemic attack (TIA), and cerebral infarction without residual deficits: Secondary | ICD-10-CM | POA: Diagnosis not present

## 2017-04-09 DIAGNOSIS — Z7982 Long term (current) use of aspirin: Secondary | ICD-10-CM | POA: Diagnosis not present

## 2017-04-09 DIAGNOSIS — Y92410 Unspecified street and highway as the place of occurrence of the external cause: Secondary | ICD-10-CM | POA: Insufficient documentation

## 2017-04-09 DIAGNOSIS — G43909 Migraine, unspecified, not intractable, without status migrainosus: Secondary | ICD-10-CM | POA: Diagnosis not present

## 2017-04-09 HISTORY — DX: Other specified symptoms and signs involving the circulatory and respiratory systems: R09.89

## 2017-04-09 HISTORY — DX: Personal history of transient ischemic attack (TIA), and cerebral infarction without residual deficits: Z86.73

## 2017-04-09 HISTORY — DX: Other specified postprocedural states: R11.2

## 2017-04-09 HISTORY — DX: Other specified postprocedural states: Z98.890

## 2017-04-09 HISTORY — DX: Displaced fracture of proximal phalanx of unspecified finger, initial encounter for closed fracture: S62.619A

## 2017-04-09 HISTORY — DX: Migraine, unspecified, not intractable, without status migrainosus: G43.909

## 2017-04-09 HISTORY — DX: Abrasion, right lower leg, initial encounter: S80.811A

## 2017-04-09 HISTORY — PX: CLOSED REDUCTION FINGER WITH PERCUTANEOUS PINNING: SHX5612

## 2017-04-09 HISTORY — DX: Dental restoration status: Z98.811

## 2017-04-09 HISTORY — DX: Patent foramen ovale: Q21.12

## 2017-04-09 HISTORY — DX: Atrial septal defect: Q21.1

## 2017-04-09 SURGERY — CLOSED REDUCTION, FINGER, WITH PERCUTANEOUS PINNING
Anesthesia: Monitor Anesthesia Care | Site: Finger | Laterality: Left

## 2017-04-09 MED ORDER — FENTANYL CITRATE (PF) 100 MCG/2ML IJ SOLN
INTRAMUSCULAR | Status: AC
  Filled 2017-04-09: qty 2

## 2017-04-09 MED ORDER — ONDANSETRON HCL 4 MG/2ML IJ SOLN: 4.0000 mg | Freq: Once | INTRAMUSCULAR | Status: DC | PRN

## 2017-04-09 MED ORDER — LACTATED RINGERS IV SOLN
INTRAVENOUS | Status: DC
  Administered 2017-04-09: 11:00:00 via INTRAVENOUS

## 2017-04-09 MED ORDER — SCOPOLAMINE 1 MG/3DAYS TD PT72
MEDICATED_PATCH | TRANSDERMAL | Status: AC
  Filled 2017-04-09: qty 1

## 2017-04-09 MED ORDER — MIDAZOLAM HCL 2 MG/2ML IJ SOLN
1.0000 mg | INTRAMUSCULAR | Status: DC | PRN
  Administered 2017-04-09: 2 mg via INTRAVENOUS

## 2017-04-09 MED ORDER — CHLORHEXIDINE GLUCONATE 4 % EX LIQD: 60.0000 mL | Freq: Once | CUTANEOUS | Status: DC

## 2017-04-09 MED ORDER — FENTANYL CITRATE (PF) 100 MCG/2ML IJ SOLN
50.0000 ug | INTRAMUSCULAR | Status: DC | PRN
  Administered 2017-04-09: 100 ug via INTRAVENOUS

## 2017-04-09 MED ORDER — PROPOFOL 10 MG/ML IV BOLUS
INTRAVENOUS | Status: DC | PRN
  Administered 2017-04-09: 150 mg via INTRAVENOUS

## 2017-04-09 MED ORDER — OXYCODONE HCL 5 MG PO TABS
5.0000 mg | ORAL_TABLET | Freq: Once | ORAL | Status: AC | PRN
Start: 2017-04-09 — End: 2017-04-09
  Administered 2017-04-09: 5 mg via ORAL

## 2017-04-09 MED ORDER — MIDAZOLAM HCL 2 MG/2ML IJ SOLN
INTRAMUSCULAR | Status: AC
  Filled 2017-04-09: qty 2

## 2017-04-09 MED ORDER — OXYCODONE HCL 5 MG/5ML PO SOLN: 5.0000 mg | Freq: Once | ORAL | Status: AC | PRN

## 2017-04-09 MED ORDER — ACETAMINOPHEN 325 MG PO TABS: 325.0000 mg | ORAL_TABLET | ORAL | Status: DC | PRN

## 2017-04-09 MED ORDER — HYDROCODONE-ACETAMINOPHEN 5-325 MG PO TABS: ORAL_TABLET | ORAL | 0 refills | Status: DC

## 2017-04-09 MED ORDER — ACETAMINOPHEN 160 MG/5ML PO SOLN: 325.0000 mg | ORAL | Status: DC | PRN

## 2017-04-09 MED ORDER — LIDOCAINE HCL (CARDIAC) 20 MG/ML IV SOLN
INTRAVENOUS | Status: DC | PRN
  Administered 2017-04-09: 80 mg via INTRAVENOUS

## 2017-04-09 MED ORDER — PROPOFOL 10 MG/ML IV BOLUS
INTRAVENOUS | Status: AC
  Filled 2017-04-09: qty 40

## 2017-04-09 MED ORDER — MEPERIDINE HCL 25 MG/ML IJ SOLN: 6.2500 mg | INTRAMUSCULAR | Status: DC | PRN

## 2017-04-09 MED ORDER — KETOROLAC TROMETHAMINE 30 MG/ML IJ SOLN: 30.0000 mg | Freq: Once | INTRAMUSCULAR | Status: DC | PRN

## 2017-04-09 MED ORDER — VANCOMYCIN HCL IN DEXTROSE 1-5 GM/200ML-% IV SOLN
INTRAVENOUS | Status: AC
  Filled 2017-04-09: qty 200

## 2017-04-09 MED ORDER — OXYCODONE HCL 5 MG PO TABS
ORAL_TABLET | ORAL | Status: AC
  Filled 2017-04-09: qty 1

## 2017-04-09 MED ORDER — FENTANYL CITRATE (PF) 100 MCG/2ML IJ SOLN
25.0000 ug | INTRAMUSCULAR | Status: DC | PRN
  Administered 2017-04-09 (×2): 50 ug via INTRAVENOUS

## 2017-04-09 MED ORDER — DEXAMETHASONE SODIUM PHOSPHATE 10 MG/ML IJ SOLN
INTRAMUSCULAR | Status: DC | PRN
  Administered 2017-04-09: 10 mg via INTRAVENOUS

## 2017-04-09 MED ORDER — BUPIVACAINE HCL (PF) 0.25 % IJ SOLN
INTRAMUSCULAR | Status: DC | PRN
  Administered 2017-04-09: 9 mL

## 2017-04-09 MED ORDER — KETOROLAC TROMETHAMINE 30 MG/ML IJ SOLN
INTRAMUSCULAR | Status: AC
  Filled 2017-04-09: qty 1

## 2017-04-09 MED ORDER — DEXAMETHASONE SODIUM PHOSPHATE 10 MG/ML IJ SOLN
INTRAMUSCULAR | Status: AC
  Filled 2017-04-09: qty 1

## 2017-04-09 MED ORDER — ONDANSETRON HCL 4 MG/2ML IJ SOLN
INTRAMUSCULAR | Status: AC
  Filled 2017-04-09: qty 2

## 2017-04-09 MED ORDER — SCOPOLAMINE 1 MG/3DAYS TD PT72
1.0000 | MEDICATED_PATCH | Freq: Once | TRANSDERMAL | Status: DC | PRN
  Administered 2017-04-09: 1.5 mg via TRANSDERMAL

## 2017-04-09 MED ORDER — ONDANSETRON HCL 4 MG/2ML IJ SOLN
INTRAMUSCULAR | Status: DC | PRN
  Administered 2017-04-09 (×2): 4 mg via INTRAVENOUS

## 2017-04-09 MED ORDER — VANCOMYCIN HCL IN DEXTROSE 1-5 GM/200ML-% IV SOLN
1000.0000 mg | INTRAVENOUS | Status: AC
  Administered 2017-04-09: 1000 mg via INTRAVENOUS

## 2017-04-09 MED FILL — HYDROCODON-APAP 5-325: 5-325 | 3 days supply | Qty: 20 | Fill #0

## 2017-04-09 SURGICAL SUPPLY — 42 items
BANDAGE ACE 3X5.8 VEL STRL LF (GAUZE/BANDAGES/DRESSINGS) ×3 IMPLANT
BLADE SURG 15 STRL LF DISP TIS (BLADE) ×2 IMPLANT
BLADE SURG 15 STRL SS (BLADE) ×6
BNDG CMPR 9X4 STRL LF SNTH (GAUZE/BANDAGES/DRESSINGS) ×1
BNDG ELASTIC 2X5.8 VLCR STR LF (GAUZE/BANDAGES/DRESSINGS) IMPLANT
BNDG ESMARK 4X9 LF (GAUZE/BANDAGES/DRESSINGS) ×3 IMPLANT
BNDG GAUZE ELAST 4 BULKY (GAUZE/BANDAGES/DRESSINGS) ×3 IMPLANT
CHLORAPREP W/TINT 26ML (MISCELLANEOUS) ×3 IMPLANT
CORD BIPOLAR FORCEPS 12FT (ELECTRODE) IMPLANT
COVER BACK TABLE 60X90IN (DRAPES) ×3 IMPLANT
COVER MAYO STAND STRL (DRAPES) ×3 IMPLANT
CUFF TOURNIQUET SINGLE 18IN (TOURNIQUET CUFF) ×3 IMPLANT
DRAPE EXTREMITY T 121X128X90 (DRAPE) ×3 IMPLANT
DRAPE OEC MINIVIEW 54X84 (DRAPES) ×3 IMPLANT
DRAPE SURG 17X23 STRL (DRAPES) ×3 IMPLANT
GAUZE SPONGE 4X4 12PLY STRL (GAUZE/BANDAGES/DRESSINGS) ×3 IMPLANT
GAUZE XEROFORM 1X8 LF (GAUZE/BANDAGES/DRESSINGS) ×3 IMPLANT
GLOVE BIO SURGEON STRL SZ7.5 (GLOVE) ×3 IMPLANT
GLOVE BIOGEL PI IND STRL 8 (GLOVE) ×1 IMPLANT
GLOVE BIOGEL PI IND STRL 8.5 (GLOVE) IMPLANT
GLOVE BIOGEL PI INDICATOR 8 (GLOVE) ×2
GLOVE BIOGEL PI INDICATOR 8.5 (GLOVE)
GLOVE SURG ORTHO 8.0 STRL STRW (GLOVE) IMPLANT
GOWN STRL REUS W/ TWL LRG LVL3 (GOWN DISPOSABLE) ×1 IMPLANT
GOWN STRL REUS W/TWL LRG LVL3 (GOWN DISPOSABLE) ×3
K-WIRE .035X4 (WIRE) ×2 IMPLANT
NDL HYPO 25X1 1.5 SAFETY (NEEDLE) IMPLANT
NEEDLE HYPO 25X1 1.5 SAFETY (NEEDLE) IMPLANT
NS IRRIG 1000ML POUR BTL (IV SOLUTION) ×3 IMPLANT
PACK BASIN DAY SURGERY FS (CUSTOM PROCEDURE TRAY) ×3 IMPLANT
PAD CAST 3X4 CTTN HI CHSV (CAST SUPPLIES) IMPLANT
PAD CAST 4YDX4 CTTN HI CHSV (CAST SUPPLIES) IMPLANT
PADDING CAST COTTON 3X4 STRL (CAST SUPPLIES)
PADDING CAST COTTON 4X4 STRL (CAST SUPPLIES)
SLEEVE SCD COMPRESS KNEE MED (MISCELLANEOUS) IMPLANT
STOCKINETTE 4X48 STRL (DRAPES) ×3 IMPLANT
SUT ETHILON 3 0 PS 1 (SUTURE) IMPLANT
SUT ETHILON 4 0 PS 2 18 (SUTURE) IMPLANT
SYR BULB 3OZ (MISCELLANEOUS) IMPLANT
SYR CONTROL 10ML LL (SYRINGE) IMPLANT
TOWEL OR 17X24 6PK STRL BLUE (TOWEL DISPOSABLE) ×6 IMPLANT
UNDERPAD 30X30 (UNDERPADS AND DIAPERS) ×3 IMPLANT
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
## 2017-04-09 NOTE — Op Note (Signed)
[id number] 

## 2017-04-09 NOTE — Transfer of Care (Signed)
Immediate Anesthesia Transfer of Care Note  Patient: Christina Goodman  Procedure(s) Performed: LEFT SMALL FINGER CLOSED REDUCTION WITH PERCUTANEOUS PINNING (Left Finger)  Patient Location: PACU  Anesthesia Type:General  Level of Consciousness: awake and patient cooperative  Airway & Oxygen Therapy: Patient Spontanous Breathing and Patient connected to face mask oxygen  Post-op Assessment: Report given to RN and Post -op Vital signs reviewed and stable  Post vital signs: Reviewed and stable  Last Vitals:  Vitals:   04/09/17 1017  BP: 131/87  Pulse: 78  Resp: 18  Temp: 37.1 C  SpO2: 100%    Last Pain:  Vitals:   04/09/17 1017  TempSrc: Oral  PainSc: 3       Patients Stated Pain Goal: 2 (57/97/28 2060)  Complications: No apparent anesthesia complications

## 2017-04-09 NOTE — Anesthesia Preprocedure Evaluation (Addendum)
Anesthesia Evaluation  Patient identified by MRN, date of birth, ID band Patient awake    Reviewed: Allergy & Precautions, NPO status , Patient's Chart, lab work & pertinent test results  History of Anesthesia Complications (+) PONV and history of anesthetic complications  Airway Mallampati: I  TM Distance: <3 FB Neck ROM: Full  Mouth opening: Limited Mouth Opening  Dental no notable dental hx.    Pulmonary neg pulmonary ROS,    Pulmonary exam normal breath sounds clear to auscultation       Cardiovascular negative cardio ROS Normal cardiovascular exam Rhythm:Regular Rate:Normal     Neuro/Psych  Headaches, CVA, No Residual Symptoms negative neurological ROS  negative psych ROS   GI/Hepatic negative GI ROS, Neg liver ROS,   Endo/Other  negative endocrine ROS  Renal/GU negative Renal ROS  negative genitourinary   Musculoskeletal negative musculoskeletal ROS (+)   Abdominal   Peds negative pediatric ROS (+)  Hematology negative hematology ROS (+)   Anesthesia Other Findings S/p TMJ surgery 2001  Reproductive/Obstetrics negative OB ROS                            Anesthesia Physical Anesthesia Plan  ASA: II  Anesthesia Plan: General   Post-op Pain Management:    Induction: Intravenous  PONV Risk Score and Plan: 3 and Treatment may vary due to age or medical condition and Scopolamine patch - Pre-op  Airway Management Planned: LMA  Additional Equipment:   Intra-op Plan:   Post-operative Plan:   Informed Consent: I have reviewed the patients History and Physical, chart, labs and discussed the procedure including the risks, benefits and alternatives for the proposed anesthesia with the patient or authorized representative who has indicated his/her understanding and acceptance.     Plan Discussed with: CRNA, Anesthesiologist and Surgeon  Anesthesia Plan Comments: ( )       Anesthesia Quick Evaluation

## 2017-04-09 NOTE — Brief Op Note (Signed)
04/09/2017  12:48 PM  PATIENT:  Joellen Jersey  46 y.o. female  PRE-OPERATIVE DIAGNOSIS:  left small finger proximal phalanx fracture  POST-OPERATIVE DIAGNOSIS:  left small finger proximal phalanx fracture  PROCEDURE:  Procedure(s): LEFT SMALL FINGER CLOSED REDUCTION WITH PERCUTANEOUS PINNING (Left)  SURGEON:  Surgeon(s) and Role:    * Leanora Cover, MD - Primary    * Daryll Brod, MD - Assisting  PHYSICIAN ASSISTANT:   ASSISTANTS: Daryll Brod, MD   ANESTHESIA:   general  EBL:  Minimal   BLOOD ADMINISTERED:none  DRAINS: none   LOCAL MEDICATIONS USED:  MARCAINE     SPECIMEN:  No Specimen  DISPOSITION OF SPECIMEN:  N/A  COUNTS:  YES  TOURNIQUET:   Total Tourniquet Time Documented: Upper Arm (Left) - 15 minutes Total: Upper Arm (Left) - 15 minutes   DICTATION: .Other Dictation: Dictation Number [id number]  PLAN OF CARE: Discharge to home after PACU  PATIENT DISPOSITION:  PACU - hemodynamically stable.

## 2017-04-09 NOTE — Op Note (Signed)
I assisted Surgeon(s) and Role:    * Leanora Cover, MD - Primary    Daryll Brod, MD - Assisting on the Procedure(s): [place] on 04/09/2017.  I provided assistance on this case as follows: setup, reduction,pinning, application of the dressing and splint.  Electronically signed by: Wynonia Sours, MD Date: 04/09/2017 Time: 12:47 PM

## 2017-04-09 NOTE — Discharge Instructions (Addendum)
°  PT TOOK OXY 2:15. Can take next pain med dose @8 :15pm   [facility] Instructions Hand Surgery  Wound Care: Keep your hand elevated above the level of your heart.  Do not allow it to dangle by your side.  Keep the dressing dry and do not remove it unless your doctor advises you to do so.  He will usually change it at the time of your post-op visit.  Moving your fingers is advised to stimulate circulation but will depend on the site of your surgery.  If you have a splint applied, your doctor will advise you regarding movement.  Activity: Do not drive or operate machinery today.  Rest today and then you may return to your normal activity and work as indicated by your physician.  Diet:  Drink liquids today or eat a light diet.  You may resume a regular diet tomorrow.    General expectations: Pain for two to three days. Fingers may become slightly swollen.  Call your doctor if any of the following occur: Severe pain not relieved by pain medication. Elevated temperature. Dressing soaked with blood. Inability to move fingers. White or bluish color to fingers.   Post Anesthesia Home Care Instructions  Activity: Get plenty of rest for the remainder of the day. A responsible individual must stay with you for 24 hours following the procedure.  For the next 24 hours, DO NOT: -Drive a car -Paediatric nurse -Drink alcoholic beverages -Take any medication unless instructed by your physician -Make any legal decisions or sign important papers.  Meals: Start with liquid foods such as gelatin or soup. Progress to regular foods as tolerated. Avoid greasy, spicy, heavy foods. If nausea and/or vomiting occur, drink only clear liquids until the nausea and/or vomiting subsides. Call your physician if vomiting continues.  Special Instructions/Symptoms: Your throat may feel dry or sore from the anesthesia or the breathing tube placed in your throat during surgery. If this causes discomfort, gargle  with warm salt water. The discomfort should disappear within 24 hours.  If you had a scopolamine patch placed behind your ear for the management of post- operative nausea and/or vomiting:  1. The medication in the patch is effective for 72 hours, after which it should be removed.  Wrap patch in a tissue and discard in the trash. Wash hands thoroughly with soap and water. 2. You may remove the patch earlier than 72 hours if you experience unpleasant side effects which may include dry mouth, dizziness or visual disturbances. 3. Avoid touching the patch. Wash your hands with soap and water after contact with the patch.

## 2017-04-09 NOTE — Anesthesia Postprocedure Evaluation (Signed)
Anesthesia Post Note  Patient: DOVEY FATZINGER  Procedure(s) Performed: LEFT SMALL FINGER CLOSED REDUCTION WITH PERCUTANEOUS PINNING (Left Finger)     Patient location during evaluation: PACU Anesthesia Type: MAC Level of consciousness: awake and alert Pain management: pain level controlled Vital Signs Assessment: post-procedure vital signs reviewed and stable Respiratory status: spontaneous breathing, nonlabored ventilation, respiratory function stable and patient connected to nasal cannula oxygen Cardiovascular status: stable and blood pressure returned to baseline Postop Assessment: no apparent nausea or vomiting Anesthetic complications: no    Last Vitals:  Vitals:   04/09/17 1300 04/09/17 1315  BP: 130/85 134/90  Pulse: 79 73  Resp: 17 14  Temp:    SpO2: 100% 100%    Last Pain:  Vitals:   04/09/17 1315  TempSrc:   PainSc: 8                  Rhen Dossantos

## 2017-04-09 NOTE — Anesthesia Procedure Notes (Addendum)
Procedure Name: LMA Insertion Date/Time: 04/09/2017 12:21 PM Performed by: Marrianne Mood, CRNA Pre-anesthesia Checklist: Patient identified, Emergency Drugs available, Suction available, Patient being monitored and Timeout performed Patient Re-evaluated:Patient Re-evaluated prior to induction Oxygen Delivery Method: Circle system utilized Preoxygenation: Pre-oxygenation with 100% oxygen Induction Type: IV induction Ventilation: Mask ventilation without difficulty LMA: LMA inserted LMA Size: 4.0 Number of attempts: 1 Airway Equipment and Method: Bite block Placement Confirmation: positive ETCO2 Tube secured with: Tape Dental Injury: Teeth and Oropharynx as per pre-operative assessment

## 2017-04-10 ENCOUNTER — Encounter (HOSPITAL_BASED_OUTPATIENT_CLINIC_OR_DEPARTMENT_OTHER): Payer: Self-pay | Admitting: Orthopedic Surgery
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
## 2017-04-10 NOTE — Addendum Note (Signed)
Addendum  created 04/10/17 5749 by Tawni Millers, CRNA   Charge Capture section accepted

## 2017-04-11 ENCOUNTER — Encounter (HOSPITAL_COMMUNITY): Payer: Self-pay | Admitting: *Deleted

## 2017-04-11 ENCOUNTER — Emergency Department (HOSPITAL_COMMUNITY): Payer: 59

## 2017-04-11 ENCOUNTER — Emergency Department (HOSPITAL_COMMUNITY)
Admission: EM | Admit: 2017-04-11 | Discharge: 2017-04-11 | Disposition: A | Discharge status: Home / Self Care | Payer: 59 | Attending: Emergency Medicine | Admitting: Emergency Medicine

## 2017-04-11 DIAGNOSIS — Z8673 Personal history of transient ischemic attack (TIA), and cerebral infarction without residual deficits: Secondary | ICD-10-CM | POA: Insufficient documentation

## 2017-04-11 DIAGNOSIS — H538 Other visual disturbances: Secondary | ICD-10-CM | POA: Insufficient documentation

## 2017-04-11 DIAGNOSIS — R9431 Abnormal electrocardiogram [ECG] [EKG]: Secondary | ICD-10-CM | POA: Diagnosis not present

## 2017-04-11 DIAGNOSIS — H5702 Anisocoria: Secondary | ICD-10-CM | POA: Insufficient documentation

## 2017-04-11 DIAGNOSIS — Z7982 Long term (current) use of aspirin: Secondary | ICD-10-CM | POA: Insufficient documentation

## 2017-04-11 DIAGNOSIS — Z79899 Other long term (current) drug therapy: Secondary | ICD-10-CM | POA: Insufficient documentation

## 2017-04-11 DIAGNOSIS — R29818 Other symptoms and signs involving the nervous system: Secondary | ICD-10-CM | POA: Diagnosis not present

## 2017-04-11 LAB — DIFFERENTIAL
BASOS ABS: 0 10*3/uL (ref 0.0–0.1)
Basophils Relative: 0 %
EOS ABS: 0.2 10*3/uL (ref 0.0–0.7)
Eosinophils Relative: 2 %
LYMPHS ABS: 4.5 10*3/uL — AB (ref 0.7–4.0)
LYMPHS PCT: 35 %
MONOS PCT: 7 %
Monocytes Absolute: 0.9 10*3/uL (ref 0.1–1.0)
NEUTROS ABS: 7.1 10*3/uL (ref 1.7–7.7)
Neutrophils Relative %: 56 %

## 2017-04-11 LAB — I-STAT TROPONIN, ED: Troponin i, poc: 0 ng/mL (ref 0.00–0.08)

## 2017-04-11 LAB — CBC
HEMATOCRIT: 38.7 % (ref 36.0–46.0)
HEMOGLOBIN: 12.4 g/dL (ref 12.0–15.0)
MCH: 29 pg (ref 26.0–34.0)
MCHC: 32 g/dL (ref 30.0–36.0)
MCV: 90.6 fL (ref 78.0–100.0)
Platelets: 296 10*3/uL (ref 150–400)
RBC: 4.27 MIL/uL (ref 3.87–5.11)
RDW: 14.6 % (ref 11.5–15.5)
WBC: 12.7 10*3/uL — AB (ref 4.0–10.5)

## 2017-04-11 LAB — I-STAT BETA HCG BLOOD, ED (MC, WL, AP ONLY): I-stat hCG, quantitative: <5 m[IU]/mL (ref <5)

## 2017-04-11 LAB — COMPREHENSIVE METABOLIC PANEL
ALBUMIN: 4.1 g/dL (ref 3.5–5.0)
ALK PHOS: 53 U/L (ref 38–126)
ALT: 14 U/L (ref 14–54)
AST: 15 U/L (ref 15–41)
Anion gap: 6 (ref 5–15)
BILIRUBIN TOTAL: 0.7 mg/dL (ref 0.3–1.2)
BUN: 20 mg/dL (ref 6–20)
CO2: 26 mmol/L (ref 22–32)
CREATININE: 0.93 mg/dL (ref 0.44–1.00)
Calcium: 8.9 mg/dL (ref 8.9–10.3)
Chloride: 106 mmol/L (ref 101–111)
GFR calc Af Amer: >60 mL/min (ref >60)
GFR calc non Af Amer: >60 mL/min (ref >60)
GLUCOSE: 93 mg/dL (ref 65–99)
POTASSIUM: 4.1 mmol/L (ref 3.5–5.1)
Sodium: 138 mmol/L (ref 135–145)
TOTAL PROTEIN: 6.9 g/dL (ref 6.5–8.1)

## 2017-04-11 LAB — I-STAT CHEM 8, ED
BUN: 19 mg/dL (ref 6–20)
CHLORIDE: 104 mmol/L (ref 101–111)
CREATININE: 1 mg/dL (ref 0.44–1.00)
Calcium, Ion: 1.15 mmol/L (ref 1.15–1.40)
Glucose, Bld: 89 mg/dL (ref 65–99)
HCT: 38 % (ref 36.0–46.0)
Hemoglobin: 12.9 g/dL (ref 12.0–15.0)
Potassium: 4.1 mmol/L (ref 3.5–5.1)
SODIUM: 141 mmol/L (ref 135–145)
TCO2: 26 mmol/L (ref 22–32)

## 2017-04-11 LAB — PROTIME-INR
INR: 0.97
Prothrombin Time: 12.8 seconds (ref 11.4–15.2)

## 2017-04-11 LAB — APTT: APTT: 27 s (ref 24–36)

## 2017-04-11 LAB — CBG MONITORING, ED: Glucose-Capillary: 93 mg/dL (ref 65–99)

## 2017-04-11 MED ORDER — GADOBENATE DIMEGLUMINE 529 MG/ML IV SOLN
20.0000 mL | Freq: Once | INTRAVENOUS | Status: AC | PRN
  Administered 2017-04-11: 19 mL via INTRAVENOUS

## 2017-04-11 MED ORDER — HYDROCODONE-ACETAMINOPHEN 5-325 MG PO TABS
1.0000 | ORAL_TABLET | Freq: Once | ORAL | Status: AC
  Administered 2017-04-11: 1 via ORAL
  Filled 2017-04-11: qty 1

## 2017-04-11 MED ORDER — LORAZEPAM 2 MG/ML IJ SOLN
1.0000 mg | INTRAMUSCULAR | Status: DC | PRN
  Administered 2017-04-11: 1 mg via INTRAVENOUS
  Filled 2017-04-11: qty 1

## 2017-04-11 NOTE — Discharge Instructions (Signed)
As discussed, your evaluation today has been largely reassuring.  But, it is important that you monitor your condition carefully, and do not hesitate to return to the ED if you develop new, or concerning changes in your condition. ? ?Otherwise, please follow-up with your physician for appropriate ongoing care. ? ?

## 2017-04-11 NOTE — ED Notes (Signed)
CODE STROKE CALLED

## 2017-04-11 NOTE — ED Notes (Signed)
Bed: WA02 Expected date:  Expected time:  Means of arrival:  Comments: Triage possible stroke
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
## 2017-04-11 NOTE — ED Notes (Signed)
ED Provider at bedside. 
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
## 2017-04-11 NOTE — ED Triage Notes (Signed)
Pt has dilated pupil and blurred vision in left eye starting 1 hour prior to arrival. No arm drift, facial droop, slurred speech, aphasia noted.

## 2017-04-11 NOTE — ED Notes (Signed)
Pt transported to MRI with transporter

## 2017-04-17 DIAGNOSIS — S62617D Displaced fracture of proximal phalanx of left little finger, subsequent encounter for fracture with routine healing: Secondary | ICD-10-CM | POA: Diagnosis not present

## 2017-04-17 DIAGNOSIS — M79642 Pain in left hand: Secondary | ICD-10-CM | POA: Diagnosis not present

## 2017-05-08 DIAGNOSIS — S62617D Displaced fracture of proximal phalanx of left little finger, subsequent encounter for fracture with routine healing: Secondary | ICD-10-CM | POA: Diagnosis not present

## 2017-05-22 DIAGNOSIS — S62617D Displaced fracture of proximal phalanx of left little finger, subsequent encounter for fracture with routine healing: Secondary | ICD-10-CM | POA: Diagnosis not present

## 2017-05-26 DIAGNOSIS — M25642 Stiffness of left hand, not elsewhere classified: Secondary | ICD-10-CM | POA: Diagnosis not present

## 2017-05-26 DIAGNOSIS — M79642 Pain in left hand: Secondary | ICD-10-CM | POA: Diagnosis not present

## 2017-06-16 DIAGNOSIS — S62617D Displaced fracture of proximal phalanx of left little finger, subsequent encounter for fracture with routine healing: Secondary | ICD-10-CM | POA: Diagnosis not present

## 2017-07-15 DIAGNOSIS — S62617D Displaced fracture of proximal phalanx of left little finger, subsequent encounter for fracture with routine healing: Secondary | ICD-10-CM | POA: Diagnosis not present

## 2017-07-21 DIAGNOSIS — R51 Headache: Secondary | ICD-10-CM | POA: Diagnosis not present

## 2017-07-21 DIAGNOSIS — M791 Myalgia, unspecified site: Secondary | ICD-10-CM | POA: Diagnosis not present

## 2017-07-21 DIAGNOSIS — G43719 Chronic migraine without aura, intractable, without status migrainosus: Secondary | ICD-10-CM | POA: Diagnosis not present

## 2017-07-21 DIAGNOSIS — M542 Cervicalgia: Secondary | ICD-10-CM | POA: Diagnosis not present

## 2017-07-21 MED FILL — ONDANSETRON HCL 4 MG TABLET: 4 | 7 days supply | Qty: 15 | Fill #0

## 2017-07-22 MED FILL — ZONISAMIDE 25 MG CAPSULE: 25 | 30 days supply | Qty: 120 | Fill #1

## 2017-07-22 MED FILL — tiZANidine HCL 4 MG TABS: 4 | 35 days supply | Qty: 20 | Fill #1

## 2017-08-26 DIAGNOSIS — S62617D Displaced fracture of proximal phalanx of left little finger, subsequent encounter for fracture with routine healing: Secondary | ICD-10-CM | POA: Diagnosis not present

## 2017-08-28 ENCOUNTER — Other Ambulatory Visit: Payer: Self-pay | Admitting: Orthopedic Surgery

## 2017-08-28 DIAGNOSIS — S62617D Displaced fracture of proximal phalanx of left little finger, subsequent encounter for fracture with routine healing: Secondary | ICD-10-CM

## 2017-09-17 MED FILL — ZONISAMIDE 25 MG CAPSULE: 25 | 30 days supply | Qty: 120 | Fill #0

## 2017-09-18 MED FILL — DICLOFENAC POT 50 MG TABLET: 50 | 3 days supply | Qty: 6 | Fill #0

## 2017-09-22 ENCOUNTER — Other Ambulatory Visit: Payer: 59

## 2017-09-22 ENCOUNTER — Ambulatory Visit
Admission: RE | Admit: 2017-09-22 | Discharge: 2017-09-22 | Disposition: A | Discharge status: Home / Self Care | Payer: 59 | Source: Ambulatory Visit | Attending: Orthopedic Surgery | Admitting: Orthopedic Surgery

## 2017-09-22 DIAGNOSIS — S62617D Displaced fracture of proximal phalanx of left little finger, subsequent encounter for fracture with routine healing: Secondary | ICD-10-CM

## 2017-09-22 DIAGNOSIS — S62647A Nondisplaced fracture of proximal phalanx of left little finger, initial encounter for closed fracture: Secondary | ICD-10-CM | POA: Diagnosis not present

## 2017-10-07 DIAGNOSIS — S62617D Displaced fracture of proximal phalanx of left little finger, subsequent encounter for fracture with routine healing: Secondary | ICD-10-CM | POA: Diagnosis not present

## 2017-11-19 DIAGNOSIS — M791 Myalgia, unspecified site: Secondary | ICD-10-CM | POA: Diagnosis not present

## 2017-11-19 DIAGNOSIS — G43719 Chronic migraine without aura, intractable, without status migrainosus: Secondary | ICD-10-CM | POA: Diagnosis not present

## 2017-11-19 DIAGNOSIS — M542 Cervicalgia: Secondary | ICD-10-CM | POA: Diagnosis not present

## 2017-11-19 DIAGNOSIS — R51 Headache: Secondary | ICD-10-CM | POA: Diagnosis not present

## 2017-11-19 MED FILL — ZONISAMIDE 50 MG CAPSULE: 50 | 30 days supply | Qty: 90 | Fill #0

## 2017-11-19 MED FILL — ONDANSETRON HCL 4 MG TABLET: 4 | 7 days supply | Qty: 15 | Fill #0

## 2017-11-19 MED FILL — tiZANidine HCL 4 MG TABS: 4 | 30 days supply | Qty: 20 | Fill #0

## 2017-11-19 MED FILL — METHOCARBAMOL 500 MG TABLET: 500 | 30 days supply | Qty: 20 | Fill #0

## 2017-12-28 MED FILL — ZONISAMIDE 50 MG CAPSULE: 50 | 30 days supply | Qty: 90 | Fill #1

## 2018-01-12 DIAGNOSIS — J0100 Acute maxillary sinusitis, unspecified: Secondary | ICD-10-CM | POA: Diagnosis not present

## 2018-01-12 DIAGNOSIS — J069 Acute upper respiratory infection, unspecified: Secondary | ICD-10-CM | POA: Diagnosis not present

## 2018-01-20 ENCOUNTER — Ambulatory Visit (HOSPITAL_COMMUNITY): Admission: EM | Admit: 2018-01-20 | Discharge: 2018-01-20 | Payer: 59

## 2018-01-20 ENCOUNTER — Other Ambulatory Visit: Payer: Self-pay

## 2018-01-20 ENCOUNTER — Emergency Department (HOSPITAL_COMMUNITY): Payer: 59

## 2018-01-20 ENCOUNTER — Encounter (HOSPITAL_COMMUNITY): Payer: Self-pay | Admitting: Emergency Medicine

## 2018-01-20 ENCOUNTER — Emergency Department (HOSPITAL_COMMUNITY)
Admission: EM | Admit: 2018-01-20 | Discharge: 2018-01-20 | Disposition: A | Discharge status: Home / Self Care | Payer: 59 | Attending: Emergency Medicine | Admitting: Emergency Medicine

## 2018-01-20 DIAGNOSIS — R002 Palpitations: Secondary | ICD-10-CM | POA: Diagnosis not present

## 2018-01-20 DIAGNOSIS — G43719 Chronic migraine without aura, intractable, without status migrainosus: Secondary | ICD-10-CM | POA: Diagnosis not present

## 2018-01-20 DIAGNOSIS — Z7982 Long term (current) use of aspirin: Secondary | ICD-10-CM | POA: Insufficient documentation

## 2018-01-20 DIAGNOSIS — Z79899 Other long term (current) drug therapy: Secondary | ICD-10-CM | POA: Diagnosis not present

## 2018-01-20 LAB — BASIC METABOLIC PANEL
Anion gap: 8 (ref 5–15)
BUN: 16 mg/dL (ref 6–20)
CALCIUM: 9.6 mg/dL (ref 8.9–10.3)
CO2: 22 mmol/L (ref 22–32)
CREATININE: 0.95 mg/dL (ref 0.44–1.00)
Chloride: 110 mmol/L (ref 98–111)
GFR calc Af Amer: >60 mL/min (ref >60)
GFR calc non Af Amer: >60 mL/min (ref >60)
Glucose, Bld: 88 mg/dL (ref 70–99)
Potassium: 4.2 mmol/L (ref 3.5–5.1)
SODIUM: 140 mmol/L (ref 135–145)

## 2018-01-20 LAB — I-STAT TROPONIN, ED: TROPONIN I, POC: 0 ng/mL (ref 0.00–0.08)

## 2018-01-20 LAB — CBC
HCT: 44 % (ref 36.0–46.0)
Hemoglobin: 13.7 g/dL (ref 12.0–15.0)
MCH: 29.3 pg (ref 26.0–34.0)
MCHC: 31.1 g/dL (ref 30.0–36.0)
MCV: 94.2 fL (ref 78.0–100.0)
PLATELETS: 292 10*3/uL (ref 150–400)
RBC: 4.67 MIL/uL (ref 3.87–5.11)
RDW: 13.3 % (ref 11.5–15.5)
WBC: 9.4 10*3/uL (ref 4.0–10.5)

## 2018-01-20 LAB — I-STAT BETA HCG BLOOD, ED (MC, WL, AP ONLY): I-stat hCG, quantitative: <5 m[IU]/mL (ref <5)

## 2018-01-20 LAB — TSH: TSH: 1.252 u[IU]/mL (ref 0.350–4.500)

## 2018-01-20 NOTE — ED Provider Notes (Signed)
[facility]ERGENC[facility] DEPARTMENT Provider Note   CSN: [id number] Arrival date & time: 01/20/18  0906     History   Chief Complaint Chief Complaint  Patient presents with  . Palpitations    HPI Christina Goodman is a 47 y.o. female.  Who presents emergency department for evaluation of palpitations.  She is a past medical history of PFO, CVA secondary to clot through the PFO and no neurologic deficits, and migraine headaches.  Patient was at her neurologist office today who sent her to the emergency department.  Patient states that she has had intermittent sensation of palpitations lasting a few seconds at a time for the past 4 days.  She has never had this in the past. Patient states that this morning she was woken out of her sleep with palpitations at around 3:00 in the morning she states that they were very frequent and lasted for about 5 minutes.  She had some shortness of breath and pain when they were occurring.  It did not radiate.  She did not have any symptoms at this time.  She denies symptoms of hyperthyroidism including tremulousness, anxiety, heat intolerance.  She denies racing heart.  Patient takes 2 baby aspirin daily and is not on any other anticoagulants.  She denies leg swelling, calf tenderness. HPI  Past Medical History:  Diagnosis Date  . Abrasion of right leg 04/07/2017  . Dental crowns present   . History of CVA (cerebrovascular accident) 2011   no deficits  . Migraine   . Migraines   . PFO (patent foramen ovale)   . PONV (postoperative nausea and vomiting)   . Proximal phalanx fracture of finger 03/2017   left small  . Runny nose 04/07/2017   clear drainage, per pt.    Patient Active Problem List   Diagnosis Date Noted  . Migraine headache 08/02/2013  . CVA (cerebral vascular accident) ([place]) 08/02/2013    Past Surgical History:  Procedure Laterality Date  . CESAREAN SECTION  04/25/2009  . CLOSED REDUCTION FINGER WITH PERCUTANEOUS  PINNING Left 04/09/2017   Procedure: LEFT SMALL FINGER CLOSED REDUCTION WITH PERCUTANEOUS PINNING;  Surgeon: Leanora Cover, MD;  Location: [facility];  Service: Orthopedics;  Laterality: Left;  Marland Kitchen MASTOIDECTOMY    . TEE WITHOUT CARDIOVERSION N/A 08/24/2013   Procedure: TRANSESOPHAGEAL ECHOCARDIOGRAM (TEE);  Surgeon: Larey Dresser, MD;  Location: [facility];  Service: Cardiovascular;  Laterality: N/A;  . TMJ ARTHROPLASTY    . TYMPANOSTOMY TUBE PLACEMENT Bilateral      OB History   None      Home Medications    Prior to Admission medications   Medication Sig Start Date End Date Taking? Authorizing Provider  aspirin 81 MG tablet Take 162 mg by mouth daily. Take 2 low dose asprin daily   Yes [provider]  Coenzyme Q10 (CO Q 10 PO) Take 1 tablet by mouth daily.   Yes [provider]  ibuprofen (ADVIL,MOTRIN) 800 MG tablet Take 800 mg by mouth every 8 (eight) hours as needed.   Yes [provider]  MAGNESIUM PO Take 1 tablet by mouth daily.   Yes [provider]  ZONISAMIDE PO Take 150 mg by mouth at bedtime.  01/30/17  Yes [provider]  HYDROcodone-acetaminophen (NORCO) 5-325 MG tablet 1-2 tabs po q6 hours prn pain Patient not taking: Reported on 01/20/2018 04/09/17   Leanora Cover, MD    Family History Family History  Problem Relation Age of  Onset  . Cancer Mother 54  . CAD Father   . Heart attack Father   . Heart attack Paternal Grandfather   . Heart attack Paternal Grandmother   . Heart attack Maternal Grandfather   . Heart attack Maternal Grandmother     Social History Social History   Tobacco Use  . Smoking status: Never Smoker  . Smokeless tobacco: Never Used  Substance Use Topics  . Alcohol use: No    Comment: occasionally  . Drug use: No     Allergies   Ivp dye [iodinated diagnostic agents]; Penicillins; Sulfa antibiotics; and Tape   Review of Systems Review of Systems Ten systems reviewed  and are negative for acute change, except as noted in the HPI.    Physical Exam Updated Vital Signs BP 126/85 (BP Location: Right Arm)   Pulse 88   Temp 98 F (36.7 C) (Oral)   Resp 18   LMP 01/19/2017 (Exact Date)   SpO2 100%   Physical Exam  Constitutional: She is oriented to person, place, and time. She appears well-developed and well-nourished. No distress.  HENT:  Head: Normocephalic and atraumatic.  Eyes: Conjunctivae are normal. No scleral icterus.  Neck: Normal range of motion.  Cardiovascular: Normal rate, regular rhythm and normal heart sounds. Exam reveals no gallop and no friction rub.  No murmur heard. Frequent palpitations  Pulmonary/Chest: Effort normal and breath sounds normal. No respiratory distress.  Abdominal: Soft. Bowel sounds are normal. She exhibits no distension and no mass. There is no tenderness. There is no guarding.  Neurological: She is alert and oriented to person, place, and time.  Skin: Skin is warm and dry. She is not diaphoretic.  Psychiatric: Her behavior is normal.  anxious  Nursing note and vitals reviewed.    ED Treatments / Results  Labs (all labs ordered are listed, but only abnormal results are displayed) Labs Reviewed  BASIC METABOLIC PANEL  CBC  TSH  I-STAT TROPONIN, ED  I-STAT BETA HCG BLOOD, ED ([facility], [facility], [facility] ONLY)    EKG EKG Interpretation  Date/Time:  Wednesday January 20 2018 09:14:01 EDT Ventricular Rate:  80 PR Interval:  156 QRS Duration: 92 QT Interval:  376 QTC Calculation: 433 R Axis:   88 Text Interpretation:  Normal sinus rhythm Normal ECG No significant change since last tracing Confirmed by Wandra Arthurs [id number]) on 01/20/2018 12:24:03 PM   Radiology Dg Chest 2 View  Result Date: 01/20/2018 CLINICAL DATA:  Cardiac palpitations EXAM: CHEST - 2 VIEW COMPARISON:  April 27, 2014. FINDINGS: Lungs are clear. Heart size and pulmonary vascularity are normal. No adenopathy. No pneumothorax. No bone lesions.  IMPRESSION: No edema or consolidation. Electronically Signed   By: Lowella Grip III M.D.   On: 01/20/2018 10:00    Procedures Procedures (including critical care time)  Medications Ordered in ED Medications - No data to display   Initial Impression / Assessment and Plan / ED Course  I have reviewed the triage vital signs and the nursing notes.  Pertinent labs & imaging results that were available during my care of the patient were reviewed by me and considered in my medical decision making (see chart for details).    Patient here for palpitations. The differential diagnosis for palpitations includes cardiac arrhythmias, PVC/PAC, ACS, Cardiomyopathy, CHF, MVP, pericarditis, valvular disease, Panic/Anxiety, Somatic disorder, ETOH, Caffeine,  Stimulant use, medication side effect, Anemia, Hyperthyroidism, pulmonary embolism. Patient has a normal EKG showing sinus rhythm. Her labs are all WNL. Normal  TSH. Her CXR is negative for acute abnormality. I personally reviewed the the PA and lateral cxr and agree with radiologic interpretation.  Patient will be discharged to follow up with cardiology.  She is not anemic.  She does not appear to have any other emergent cause of her palpitations.  She is ruled out by PE rule out criteria.  I discussed return precautions.  She was appropriate for discharge at this time  Final Clinical Impressions(s) / ED Diagnoses   Final diagnoses:  Palpitations    ED Discharge Orders    None       Margarita Mail, PA-C 01/20/18 1648    Drenda Freeze, MD 01/22/18 2134

## 2018-01-20 NOTE — ED Notes (Signed)
States she feels like her heart is beating "hard then feels like it stops. ". Only chest pain when episodes appear. C/o sob.

## 2018-01-20 NOTE — ED Triage Notes (Signed)
Pt with c/o heart palpitations that woke her up out of her sleep this morning. Denies hx of a.fib no, sob, chest pain. Reports that currently her symptoms are mild but that will return. Pt is alert and oriented.

## 2018-01-20 NOTE — ED Notes (Signed)
Patient given discharge instructions and verbalized understanding.  Patient stable to discharge at this time.  Patient is alert and oriented to baseline.  No distressed noted at this time.  All belongings taken with the patient at discharge.   

## 2018-01-20 NOTE — Discharge Instructions (Signed)
Your work up was negative. You had normal labs including your thyroid and a normal EKG. There does not appear to be an emergency cause of your palpitations. Contact a health care provider if: You continue to have a fast or irregular heartbeat after 24 hours. Your palpitations occur more often. Get help right away if: You have chest pain or shortness of breath. You have a severe headache. You feel dizzy or you faint.

## 2018-01-20 NOTE — ED Notes (Signed)
Patient is aware she is waiting on her lab results.

## 2018-01-27 ENCOUNTER — Encounter: Payer: Self-pay | Admitting: Internal Medicine

## 2018-01-27 ENCOUNTER — Ambulatory Visit: Payer: 59 | Admitting: Internal Medicine

## 2018-01-27 ENCOUNTER — Telehealth: Payer: Self-pay | Admitting: Internal Medicine

## 2018-01-27 VITALS — BP 125/93 | HR 89 | Ht 69.0 in | Wt 177.0 lb

## 2018-01-27 DIAGNOSIS — R0789 Other chest pain: Secondary | ICD-10-CM

## 2018-01-27 DIAGNOSIS — Z8249 Family history of ischemic heart disease and other diseases of the circulatory system: Secondary | ICD-10-CM

## 2018-01-27 DIAGNOSIS — R002 Palpitations: Secondary | ICD-10-CM

## 2018-01-27 LAB — LIPID PANEL
Chol/HDL Ratio: 2.7 ratio (ref 0.0–4.4)
Cholesterol, Total: 163 mg/dL (ref 100–199)
HDL: 60 mg/dL (ref >39)
LDL Calculated: 91 mg/dL (ref 0–99)
Triglycerides: 61 mg/dL (ref 0–149)
VLDL CHOLESTEROL CAL: 12 mg/dL (ref 5–40)

## 2018-01-27 NOTE — Progress Notes (Signed)
OFFICE CONSULT NOTE  Chief Complaint:  Palpitations, chest pressure  Primary Care Physician: Patient, No Pcp Per  HPI:  Christina Goodman is a 47 y.o. female who is being seen today for the evaluation of palpitations, chest pressure at the request of Drenda Freeze, MD.  This is a 47 year old female who recently was seen in the emergency department on October 2 after being awakened with severe palpitations.  She says she has this very regularly.  Recently they have been more significant.  A few weeks ago she had upper respiratory infection and was on some cough medication.  It was originally attributed to that however has persisted.  Overall she seems fairly healthy other has history of migraine headaches which is been difficult to control does see a headache specialist Dr. Ruthann Cancer, and he is on zonisamide 150 mg at night.  She also apparently receives steroid injections.  She had a history of a stroke by imaging only in 2011.  She subsequently was found by echo and TEE to have a PFO.  At the time there was no data regarding closure of PFO for either stroke or migraine benefit.  Subsequently trials have not demonstrated benefit in reducing migraines however in patients earlier than age 44 there is a stroke reduction benefit with PFO closure.  That being said, she is done well on aspirin clinically since that time.  She also reports chest pressure.  This happens mostly associated with the episodes of palpitations.  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  There is a strong family history of heart disease in her father who had his first heart attack in his 52s, although, she said she was a smoker.  PMHx:  Past Medical History:  Diagnosis Date  . Abrasion of right leg 04/07/2017  . Dental crowns present   . History of CVA (cerebrovascular accident) 2011   no deficits  . Migraine   . Migraines   . PFO (patent foramen  ovale)   . PONV (postoperative nausea and vomiting)   . Proximal phalanx fracture of finger 03/2017   left small  . Runny nose 04/07/2017   clear drainage, per pt.    Past Surgical History:  Procedure Laterality Date  . CESAREAN SECTION  04/25/2009  . CLOSED REDUCTION FINGER WITH PERCUTANEOUS PINNING Left 04/09/2017   Procedure: LEFT SMALL FINGER CLOSED REDUCTION WITH PERCUTANEOUS PINNING;  Surgeon: Leanora Cover, MD;  Location: [facility];  Service: Orthopedics;  Laterality: Left;  Marland Kitchen MASTOIDECTOMY    . TEE WITHOUT CARDIOVERSION N/A 08/24/2013   Procedure: TRANSESOPHAGEAL ECHOCARDIOGRAM (TEE);  Surgeon: Larey Dresser, MD;  Location: [facility];  Service: Cardiovascular;  Laterality: N/A;  . TMJ ARTHROPLASTY    . TYMPANOSTOMY TUBE PLACEMENT Bilateral     FAMHx:  Family History  Problem Relation Age of Onset  . Cancer Mother 61  . CAD Father   . Heart attack Father   . Heart attack Paternal Grandfather   . Heart attack Paternal Grandmother   . Heart attack Maternal Grandfather   . Heart attack Maternal Grandmother     SOCHx:   reports that she has never smoked. She has never used smokeless tobacco. She reports that she does not drink alcohol or use drugs.  ALLERGIES:  Allergies  Allergen Reactions  . Ivp Dye [Iodinated Diagnostic Agents] Hives  . Penicillins Hives    Childhood Has patient had a PCN  reaction causing immediate rash, facial/tongue/throat swelling, SOB or lightheadedness with hypotension: No Has patient had a PCN reaction causing severe rash involving mucus membranes or skin necrosis: Yes Has patient had a PCN reaction that required hospitalization: No Has patient had a PCN reaction occurring within the last 10 years: No If all of the above answers are "NO", then may proceed with Cephalosporin use.   . Sulfa Antibiotics Hives  . Tape Other (See Comments)    BLOOD BLISTERS    ROS: Pertinent items noted in HPI and remainder of  comprehensive ROS otherwise negative.  HOME MEDS: Current Outpatient Medications on File Prior to Visit  Medication Sig Dispense Refill  . aspirin 81 MG tablet Take 162 mg by mouth daily. Take 2 low dose asprin daily    . Coenzyme Q10 (CO Q 10 PO) Take 1 tablet by mouth daily.    Marland Kitchen MAGNESIUM PO Take 1 tablet by mouth daily.    Marland Kitchen ZONISAMIDE PO Take 150 mg by mouth at bedtime.   1   No current facility-administered medications on file prior to visit.     LABS/IMAGING: No results found for this or any previous visit (from the past 48 hour(s)). No results found.  LIPID PANEL: No results found for: CHOL, TRIG, HDL, CHOLHDL, VLDL, LDLCALC, LDLDIRECT  WEIGHTS: Wt Readings from Last 3 Encounters:  01/27/18 177 lb (80.3 kg)  04/09/17 193 lb (87.5 kg)  12/14/13 203 lb (92.1 kg)    VITALS: BP (!) 125/93   Pulse 89   Ht 5\' 9"  (1.753 m)   Wt 177 lb (80.3 kg)   BMI 26.14 kg/m   EXAM: General appearance: alert and no distress Neck: no carotid bruit, no JVD and thyroid not enlarged, symmetric, no tenderness/mass/nodules Lungs: clear to auscultation bilaterally Heart: regular rate and rhythm Abdomen: soft, non-tender; bowel sounds normal; no masses,  no organomegaly Extremities: extremities normal, atraumatic, no cyanosis or edema Pulses: 2+ and symmetric Skin: Skin color, texture, turgor normal. No rashes or lesions Neurologic: Grossly normal Psych: Pleasant  EKG: Normal sinus rhythm at 80 (01/21/2018 - ER EKG)- personally reviewed  ASSESSMENT: 1. Palpitations 2. Chest pressure 3. Anxiety 4. History of migraines 5. History of stroke 6. PFO (not previously closed)  PLAN: 1.   [name] is describing palpitations which are on a daily basis however recently much worse.  She does have history of migraines and a prior PFO with history of stroke but has not had a PFO closed since the last episode was in 2011.  She is done well on aspirin since then.  There is now data for PFO  closure however since she is done clinically well this is unlikely to be pursued at this point.  She is also reporting chest pressure which seems to be associated with palpitations.  I doubt this is angina however would like to exclude it based on her family history of premature coronary disease.  I recommend an exercise treadmill stress test as well as a 72-hour ZIO Patch monitor.  This will allow [place] to try to capture her palpitations better.  Ultimately she may be a good candidate for nonselective beta-blocker therapy which could hopefully help her migraines and palpitations.  Plan follow-up with me afterwards.  Pixie Casino, MD, [facility], [place] Director of the [facility] Lip[facility]  Card[facility] Diplomate of the American Board of Clinical Lipidology Attending Cardiologist  Direct Dial: [phone]  Fax:  [phone]  Website:  [email or web address] 01/27/2018, 8:39 AM

## 2018-01-27 NOTE — Patient Instructions (Signed)
Medication Instructions:  Continue current medications   If you need a refill on your cardiac medications before your next appointment, please call your pharmacy.    Lab work: FASTING lab work today If you have labs (blood work) drawn today and your tests are completely normal, you will receive your results only by: Marland Kitchen MyChart Message (if you have MyChart) OR . A paper copy in the mail If you have any lab test that is abnormal or we need to change your treatment, we will call you to review the results.  Testing/Procedures: Your physician has recommended that you wear an event monitor. Event monitors are medical devices that record the heart's electrical activity. Doctors most often [place] these monitors to diagnose arrhythmias. Arrhythmias are problems with the speed or rhythm of the heartbeat. The monitor is a small, portable device. You can wear one while you do your normal daily activities. This is usually used to diagnose what is causing palpitations/syncope (passing out). -- you will wear this Zio monitor for 3 days -- appointment is at [address]  Your physician has requested that you have an exercise tolerance test.  -- appointment is at Dr. Lysbeth Penner office  Follow-Up: At [facility], you and your health needs are our priority.  As part of our continuing mission to provide you with exceptional heart care, we have created designated Provider Care Teams.  These Care Teams include your primary Cardiologist (physician) and Advanced Practice Providers (APPs -  Physician Assistants and Nurse Practitioners) who all work together to provide you with the care you need, when you need it. You will need a follow up appointment after your testing is completed. You may see Dr. Debara Pickett or one of the following Advanced Practice Providers on your designated Care Team: Almyra Deforest, [place] . Fabian Sharp, PA-C  Any Other Special Instructions Will Be Listed Below (If  Applicable).  Exercise Stress Electrocardiogram An exercise stress electrocardiogram is a test that is done to evaluate the blood supply to your heart. This test may also be called exercise stress electrocardiography. The test is done while you are walking on a treadmill. The goal of this test is to raise your heart rate. This test is done to find areas of poor blood flow to the heart by determining the extent of coronary artery disease (CAD). CAD is defined as narrowing in one or more heart (coronary) arteries of more than 70%. If you have an abnormal test result, this may mean that you are not getting adequate blood flow to your heart during exercise. Additional testing may be needed to understand why your test was abnormal. Tell a health care provider about:  Any allergies you have.  All medicines you are taking, including vitamins, herbs, eye drops, creams, and over-the-counter medicines.  Any problems you or family members have had with anesthetic medicines.  Any blood disorders you have.  Any surgeries you have had.  Any medical conditions you have.  Possibility of pregnancy, if this applies. What are the risks? Generally, this is a safe procedure. However, as with any procedure, complications can occur. Possible complications can include:  Pain or pressure in the following areas: ? Chest. ? Jaw or neck. ? Between your shoulder blades. ? Radiating down your left arm.  Dizziness or light-headedness.  Shortness of breath.  Increased or irregular heartbeats.  Nausea or vomiting.  Heart attack (rare).  What happens before the procedure?  Avoid all forms of caffeine 24 hours  before your test or as directed by your health care provider. This includes coffee, tea (even decaffeinated tea), caffeinated sodas, chocolate, cocoa, and certain pain medicines.  Follow your health care provider's instructions regarding eating and drinking before the test.  Take your medicines as  directed at regular times with water unless instructed otherwise. Exceptions may include: ? If you have diabetes, ask how you are to take your insulin or pills. It is common to adjust insulin dosing the morning of the test. ? If you are taking beta-blocker medicines, it is important to talk to your health care provider about these medicines well before the date of your test. Taking beta-blocker medicines may interfere with the test. In some cases, these medicines need to be changed or stopped 24 hours or more before the test. ? If you wear a nitroglycerin patch, it may need to be removed prior to the test. Ask your health care provider if the patch should be removed before the test.  If you use an inhaler for any breathing condition, bring it with you to the test.  If you are an outpatient, bring a snack so you can eat right after the stress phase of the test.  Do not smoke for 4 hours prior to the test or as directed by your health care provider.  Do not apply lotions, powders, creams, or oils on your chest prior to the test.  Wear loose-fitting clothes and comfortable shoes for the test. This test involves walking on a treadmill. What happens during the procedure?  Multiple patches (electrodes) will be put on your chest. If needed, small areas of your chest may have to be shaved to get better contact with the electrodes. Once the electrodes are attached to your body, multiple wires will be attached to the electrodes and your heart rate will be monitored.  Your heart will be monitored both at rest and while exercising.  You will walk on a treadmill. The treadmill will be started at a slow pace. The treadmill speed and incline will gradually be increased to raise your heart rate. What happens after the procedure?  Your heart rate and blood pressure will be monitored after the test.  You may return to your normal schedule including diet, activities, and medicines, unless your health care  provider tells you otherwise. This information is not intended to replace advice given to you by your health care provider. Make sure you discuss any questions you have with your health care provider. Document Released: 04/04/2000 Document Revised: 09/13/2015 Document Reviewed: 12/13/2012 Elsevier Interactive Patient Education  2017 [employer].

## 2018-01-27 NOTE — Telephone Encounter (Signed)
01/27/2018 Received faxed FMLA form from [employer] on patient, I inter-officed it to [place] to process. cbr

## 2018-02-01 ENCOUNTER — Other Ambulatory Visit: Payer: Self-pay | Admitting: Internal Medicine

## 2018-02-01 MED ORDER — ATORVASTATIN CALCIUM 20 MG PO TABS: 20.0000 mg | ORAL_TABLET | Freq: Every day | ORAL | 3 refills | Status: DC

## 2018-02-01 MED FILL — ATORVASTATIN CALCIUM 20 MG: 20 | 90 days supply | Qty: 90 | Fill #0

## 2018-02-01 MED FILL — METHOCARBAMOL 500 MG TABLET: 500 | 28 days supply | Qty: 20 | Fill #0

## 2018-02-01 MED FILL — ONDANSETRON HCL 4 MG TABLET: 4 | 7 days supply | Qty: 15 | Fill #0

## 2018-02-01 MED FILL — ZONISAMIDE 50 MG CAPSULE: 50 | 30 days supply | Qty: 90 | Fill #0

## 2018-02-04 ENCOUNTER — Telehealth (HOSPITAL_COMMUNITY): Payer: Self-pay

## 2018-02-04 NOTE — Telephone Encounter (Signed)
Encounter complete. 

## 2018-02-09 ENCOUNTER — Ambulatory Visit (HOSPITAL_COMMUNITY)
Admission: RE | Admit: 2018-02-09 | Discharge: 2018-02-09 | Disposition: A | Discharge status: Home / Self Care | Payer: 59 | Source: Ambulatory Visit | Attending: Cardiology | Admitting: Cardiology

## 2018-02-09 DIAGNOSIS — R002 Palpitations: Secondary | ICD-10-CM | POA: Diagnosis not present

## 2018-02-09 DIAGNOSIS — R0789 Other chest pain: Secondary | ICD-10-CM

## 2018-02-09 LAB — EXERCISE TOLERANCE TEST
CHL CUP MPHR: 176 {beats}/min
CSEPED: 9 min
CSEPHR: 100 %
CSEPPHR: 176 {beats}/min
Estimated workload: 11.6 METS
Exercise duration (sec): 57 s
RPE: 17
Rest HR: 80 {beats}/min

## 2018-02-10 ENCOUNTER — Ambulatory Visit (INDEPENDENT_AMBULATORY_CARE_PROVIDER_SITE_OTHER): Payer: 59

## 2018-02-10 DIAGNOSIS — R002 Palpitations: Secondary | ICD-10-CM

## 2018-02-12 ENCOUNTER — Telehealth: Payer: Self-pay | Admitting: *Deleted

## 2018-02-12 NOTE — Telephone Encounter (Signed)
FMLA paperwork received from provider completed and will be interoffice to [place]

## 2018-02-19 DIAGNOSIS — Z118 Encounter for screening for other infectious and parasitic diseases: Secondary | ICD-10-CM | POA: Diagnosis not present

## 2018-02-19 DIAGNOSIS — Z114 Encounter for screening for human immunodeficiency virus [HIV]: Secondary | ICD-10-CM | POA: Diagnosis not present

## 2018-02-19 DIAGNOSIS — Z01419 Encounter for gynecological examination (general) (routine) without abnormal findings: Secondary | ICD-10-CM | POA: Diagnosis not present

## 2018-02-19 DIAGNOSIS — N760 Acute vaginitis: Secondary | ICD-10-CM | POA: Diagnosis not present

## 2018-02-19 DIAGNOSIS — Z1159 Encounter for screening for other viral diseases: Secondary | ICD-10-CM | POA: Diagnosis not present

## 2018-02-19 DIAGNOSIS — Z113 Encounter for screening for infections with a predominantly sexual mode of transmission: Secondary | ICD-10-CM | POA: Diagnosis not present

## 2018-02-19 DIAGNOSIS — E282 Polycystic ovarian syndrome: Secondary | ICD-10-CM | POA: Diagnosis not present

## 2018-02-19 DIAGNOSIS — Z1151 Encounter for screening for human papillomavirus (HPV): Secondary | ICD-10-CM | POA: Diagnosis not present

## 2018-02-19 DIAGNOSIS — R002 Palpitations: Secondary | ICD-10-CM | POA: Diagnosis not present

## 2018-02-19 DIAGNOSIS — Z6826 Body mass index (BMI) 26.0-26.9, adult: Secondary | ICD-10-CM | POA: Diagnosis not present

## 2018-02-19 DIAGNOSIS — Z1231 Encounter for screening mammogram for malignant neoplasm of breast: Secondary | ICD-10-CM | POA: Diagnosis not present

## 2018-02-19 MED FILL — TINIDAZOLE 500 MG TABS: 500 | 5 days supply | Qty: 10 | Fill #0

## 2018-02-19 MED FILL — metFORMIN HCL ER 500 MG TB2: 500 | 90 days supply | Qty: 173 | Fill #0

## 2018-02-25 ENCOUNTER — Encounter: Payer: Self-pay | Admitting: Internal Medicine

## 2018-02-25 ENCOUNTER — Ambulatory Visit: Payer: 59 | Admitting: Internal Medicine

## 2018-02-25 VITALS — BP 131/85 | HR 73 | Ht 69.0 in | Wt 184.6 lb

## 2018-02-25 DIAGNOSIS — Q2112 Patent foramen ovale: Secondary | ICD-10-CM

## 2018-02-25 DIAGNOSIS — I493 Ventricular premature depolarization: Secondary | ICD-10-CM | POA: Diagnosis not present

## 2018-02-25 DIAGNOSIS — Q211 Atrial septal defect: Secondary | ICD-10-CM

## 2018-02-25 MED ORDER — PROPRANOLOL HCL ER 80 MG PO CP24: 80.0000 mg | ORAL_CAPSULE | Freq: Every day | ORAL | 3 refills | Status: DC

## 2018-02-25 MED FILL — PROPRANOLOL ER 80 MG CAP: 80 | 90 days supply | Qty: 90 | Fill #0

## 2018-02-25 NOTE — Patient Instructions (Signed)
Medication Instructions:  START propranolol 80mg  once daily If you need a refill on your cardiac medications before your next appointment, please call your pharmacy.   Follow-Up: At [facility] [place], you and your health needs are our priority.  As part of our continuing mission to provide you with exceptional heart care, we have created designated Provider Care Teams.  These Care Teams include your primary Cardiologist (physician) and Advanced Practice Providers (APPs -  Physician Assistants and Nurse Practitioners) who all work together to provide you with the care you need, when you need it. You will need a follow up appointment in 1 month with Dr. Debara Pickett

## 2018-02-25 NOTE — Progress Notes (Signed)
OFFICE CONSULT NOTE  Chief Complaint:  Follow-up palpitations  Primary Care Physician: Patient, No Pcp Per  HPI:  Christina Goodman is a 47 y.o. female who is being seen today for the evaluation of palpitations, chest pressure at the request of No ref. provider found.  This is a 47 year old female who recently was seen in the emergency department on October 2 after being awakened with severe palpitations.  She says she has this very regularly.  Recently they have been more significant.  A few weeks ago she had upper respiratory infection and was on some cough medication.  It was originally attributed to that however has persisted.  Overall she seems fairly healthy other has history of migraine headaches which is been difficult to control does see a headache specialist Dr. Ruthann Cancer, and he is on zonisamide 150 mg at night.  She also apparently receives steroid injections.  She had a history of a stroke by imaging only in 2011.  She subsequently was found by echo and TEE to have a PFO.  At the time there was no data regarding closure of PFO for either stroke or migraine benefit.  Subsequently trials have not demonstrated benefit in reducing migraines however in patients earlier than age 4 there is a stroke reduction benefit with PFO closure.  That being said, she is done well on aspirin clinically since that time.  She also reports chest pressure.  This happens mostly associated with the episodes of palpitations.  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  There is a strong family history of heart disease in her father who had his first heart attack in his 86s, although, she said she was a smoker.  02/25/2018  Christina Goodman returns today for follow-up of palpitations.  She underwent exercise treadmill stress testing which was negative for ischemia but did show hypertensive response for exercise.  She did wear a monitor which indicated  sinus rhythm with frequent isolated PVCs that occurred sometimes in bigeminy and trigeminy with a burden of PVCs of 12.6% which is actually quite frequent.  She seems quite upset today.  She says she feels like her heart is very "full in her chest".  She said she can hardly tolerate this anymore.  She is also bothered by more frequent migraine headaches.  She is interested in possible closure of her PFO which was not approved previously however the data now is much clearer in patients who have had prior stroke or TIA with known PFO.  PMHx:  Past Medical History:  Diagnosis Date  . Abrasion of right leg 04/07/2017  . Dental crowns present   . History of CVA (cerebrovascular accident) 2011   no deficits  . Migraine   . Migraines   . PFO (patent foramen ovale)   . PONV (postoperative nausea and vomiting)   . Proximal phalanx fracture of finger 03/2017   left small  . Runny nose 04/07/2017   clear drainage, per pt.    Past Surgical History:  Procedure Laterality Date  . CESAREAN SECTION  04/25/2009  . CLOSED REDUCTION FINGER WITH PERCUTANEOUS PINNING Left 04/09/2017   Procedure: LEFT SMALL FINGER CLOSED REDUCTION WITH PERCUTANEOUS PINNING;  Surgeon: Leanora Cover, MD;  Location: [facility];  Service: Orthopedics;  Laterality: Left;  Marland Kitchen MASTOIDECTOMY    . TEE WITHOUT CARDIOVERSION N/A 08/24/2013   Procedure: TRANSESOPHAGEAL ECHOCARDIOGRAM (TEE);  Surgeon: Larey Dresser, MD;  Location: [facility]  ENDOSCOPY;  Service: Cardiovascular;  Laterality: N/A;  . TMJ ARTHROPLASTY    . TYMPANOSTOMY TUBE PLACEMENT Bilateral     FAMHx:  Family History  Problem Relation Age of Onset  . Cancer Mother 91  . CAD Father   . Heart attack Father   . Heart attack Paternal Grandfather   . Heart attack Paternal Grandmother   . Heart attack Maternal Grandfather   . Heart attack Maternal Grandmother     SOCHx:   reports that she has never smoked. She has never used smokeless tobacco. She reports  that she does not drink alcohol or use drugs.  ALLERGIES:  Allergies  Allergen Reactions  . Ivp Dye [Iodinated Diagnostic Agents] Hives  . Penicillins Hives    Childhood Has patient had a PCN reaction causing immediate rash, facial/tongue/throat swelling, SOB or lightheadedness with hypotension: No Has patient had a PCN reaction causing severe rash involving mucus membranes or skin necrosis: Yes Has patient had a PCN reaction that required hospitalization: No Has patient had a PCN reaction occurring within the last 10 years: No If all of the above answers are "NO", then may proceed with Cephalosporin use.   . Sulfa Antibiotics Hives  . Tape Other (See Comments)    BLOOD BLISTERS    ROS: Pertinent items noted in HPI and remainder of comprehensive ROS otherwise negative.  HOME MEDS: Current Outpatient Medications on File Prior to Visit  Medication Sig Dispense Refill  . aspirin 81 MG tablet Take 162 mg by mouth daily. Take 2 low dose asprin daily    . atorvastatin (LIPITOR) 20 MG tablet Take 1 tablet (20 mg total) by mouth daily. 90 tablet 3  . Coenzyme Q10 (CO Q 10 PO) Take 1 tablet by mouth daily.    Marland Kitchen MAGNESIUM PO Take 1 tablet by mouth daily.    Marland Kitchen ZONISAMIDE PO Take 150 mg by mouth at bedtime.   1   No current facility-administered medications on file prior to visit.     LABS/IMAGING: No results found for this or any previous visit (from the past 48 hour(s)). No results found.  LIPID PANEL:    Component Value Date/Time   CHOL 163 01/27/2018 0902   TRIG 61 01/27/2018 0902   HDL 60 01/27/2018 0902   CHOLHDL 2.7 01/27/2018 0902   LDLCALC 91 01/27/2018 0902    WEIGHTS: Wt Readings from Last 3 Encounters:  02/25/18 184 lb 9.6 oz (83.7 kg)  01/27/18 177 lb (80.3 kg)  04/09/17 193 lb (87.5 kg)    VITALS: BP 131/85   Pulse 73   Ht 5\' 9"  (1.753 m)   Wt 184 lb 9.6 oz (83.7 kg)   BMI 27.26 kg/m   EXAM: Deferred  EKG: Deferred  ASSESSMENT: 1. Palpitations  -frequent PVCs (burden 12.6%) 2. Chest pressure -low risk exercise treadmill stress test (01/2018) 3. Anxiety 4. History of migraines 5. History of stroke 6. PFO (not previously closed)  PLAN: 1.   Ms. Hinderliter is noted to have frequent PVCs with a burden of about 12.6%.  She had a low risk exercise treadmill stress test with hypertensive response to exercise.  I tried to reassure her that her palpitations are likely low risk.  I think we can possibly treat both her palpitations and migraines with a beta-blocker.  I would recommend propranolol XR 80 mg daily.  In addition based on the new PFO closure data, she is interested in evaluation for possible PFO closure.  I will refer her to  Dr. Burt Knack who had seen her previously.  Follow-up with me in a month to see if her symptoms have improved.  Pixie Casino, MD, [facility], [place] Director of the [facility] Lipid Disorders &  Cardiovascular Risk Reduction [facility] Diplomate of the American Board of Clinical Lipidology Attending Cardiologist  Direct Dial: [phone]  Fax: [phone]  Website:  [email or web address] [email or web address] Forstrom 02/25/2018, 4:11 PM

## 2018-02-26 ENCOUNTER — Telehealth: Payer: Self-pay

## 2018-02-26 NOTE — Telephone Encounter (Signed)
-----   Message from Fidel Levy, RN sent at 02/25/2018  4:31 PM EST ----- Regarding: referral to Dr. Coral Ceo!   We have been seeing this patient for PVCs and she has a known PFO. She saw Burt Knack in 2015 and is interested in seeing him again to discuss PFO closure. I put a referral in today, just wanted to make you aware in case there are openings soon with him. She is interested in seeing him ASAP.   Thanks!

## 2018-02-26 NOTE — Telephone Encounter (Signed)
Per Dr. Debara Pickett, called to schedule PFO consult.  Left message to call back.

## 2018-02-26 NOTE — Telephone Encounter (Signed)
Scheduled patient for PFO consult 11/18 with Dr. Burt Knack.  She was grateful for assistance.

## 2018-03-08 ENCOUNTER — Ambulatory Visit: Payer: 59 | Admitting: Cardiovascular Disease

## 2018-03-08 ENCOUNTER — Encounter: Payer: Self-pay | Admitting: Cardiovascular Disease

## 2018-03-08 VITALS — BP 92/62 | HR 62 | Ht 69.0 in | Wt 184.1 lb

## 2018-03-08 DIAGNOSIS — Q2112 Patent foramen ovale: Secondary | ICD-10-CM

## 2018-03-08 DIAGNOSIS — Q211 Atrial septal defect: Secondary | ICD-10-CM | POA: Diagnosis not present

## 2018-03-08 NOTE — Progress Notes (Signed)
Cardiology Office Note:    Date:  03/08/2018   ID:  Christina Goodman, DOB 22-Dec-1970, MRN [id number]  PCP:  Patient, No Pcp Per  Cardiologist:  No primary care provider on file.  Electrophysiologist:  None   Referring MD: No ref. provider found   Chief Complaint  Patient presents with  . PFO Evaluation    History of Present Illness:    Christina Goodman is a 47 y.o. female with a hx of the PFO and cryptogenic stroke, presenting for evaluation of transcatheter PFO closure.  The patient was seen back in 2015 after an MRI of the brain demonstrated an old cerebellar infarction.  She underwent an echo study that demonstrated right to left shunting and a TEE was performed.  This demonstrated a moderate sized PFO with spontaneous right to left shunting at rest based on agitated saline study.  At the time we elected to treat her medically.  She is referred back today for consideration of transcatheter PFO closure based on more recent data that has demonstrated benefit in patients with cryptogenic stroke with respect to recurrent stroke risk.  The patient is here alone today.  She continues to have problems with chronic migraine headaches.  She is had a lot of difficulty with heart palpitations and symptomatic PVCs.  She is been started on extended release propranolol and has noted modest improvement but continues to be frustrated with her PVCs.  She otherwise denies chest pain, chest pressure, shortness of breath, lightheadedness, or syncope.  Since I have seen her, she has had other neurologic symptoms prompting further imaging studies.  These follow-up studies have continued to demonstrate her old cerebellar infarction but have not shown any interval cerebral infarctions.  Past Medical History:  Diagnosis Date  . Abrasion of right leg 04/07/2017  . Dental crowns present   . History of CVA (cerebrovascular accident) 2011   no deficits  . Migraine   . Migraines   . PFO (patent foramen ovale)     . PONV (postoperative nausea and vomiting)   . Proximal phalanx fracture of finger 03/2017   left small  . Runny nose 04/07/2017   clear drainage, per pt.    Past Surgical History:  Procedure Laterality Date  . CESAREAN SECTION  04/25/2009  . CLOSED REDUCTION FINGER WITH PERCUTANEOUS PINNING Left 04/09/2017   Procedure: LEFT SMALL FINGER CLOSED REDUCTION WITH PERCUTANEOUS PINNING;  Surgeon: Leanora Cover, MD;  Location: [facility];  Service: Orthopedics;  Laterality: Left;  Marland Kitchen MASTOIDECTOMY    . TEE WITHOUT CARDIOVERSION N/A 08/24/2013   Procedure: TRANSESOPHAGEAL ECHOCARDIOGRAM (TEE);  Surgeon: Larey Dresser, MD;  Location: [facility];  Service: Cardiovascular;  Laterality: N/A;  . TMJ ARTHROPLASTY    . TYMPANOSTOMY TUBE PLACEMENT Bilateral     Current Medications: Current Meds  Medication Sig  . aspirin 81 MG tablet Take 162 mg by mouth daily. Take 2 low dose asprin daily  . atorvastatin (LIPITOR) 20 MG tablet Take 1 tablet (20 mg total) by mouth daily.  . Coenzyme Q10 (CO Q 10 PO) Take 1 tablet by mouth daily.  Marland Kitchen MAGNESIUM PO Take 1 tablet by mouth daily.  . metFORMIN (GLUCOPHAGE) 500 MG tablet Take 500 mg by mouth 2 (two) times daily with a meal.  . propranolol ER (INDERAL LA) 80 MG 24 hr capsule Take 1 capsule (80 mg total) by mouth daily.  Marland Kitchen ZONISAMIDE PO Take 150 mg by mouth at bedtime.      Allergies:  Ivp dye [iodinated diagnostic agents]; Penicillins; Sulfa antibiotics; and Tape   Social History   Socioeconomic History  . Marital status: Single    Spouse name: Not on file  . Number of children: 0  . Years of education: Not on file  . Highest education level: Not on file  Occupational History  . Occupation: ultrasound Engineer, production: [employer]  . Financial resource strain: Not on file  . Food insecurity:    Worry: Not on file    Inability: Not on file  . Transportation needs:    Medical: Not on file    Non-medical: Not on  file  Tobacco Use  . Smoking status: Never Smoker  . Smokeless tobacco: Never Used  Substance and Sexual Activity  . Alcohol use: No    Comment: occasionally  . Drug use: No  . Sexual activity: Yes  Lifestyle  . Physical activity:    Days per week: Not on file    Minutes per session: Not on file  . Stress: Not on file  Relationships  . Social connections:    Talks on phone: Not on file    Gets together: Not on file    Attends religious service: Not on file    Active member of club or organization: Not on file    Attends meetings of clubs or organizations: Not on file    Relationship status: Not on file  Other Topics Concern  . Not on file  Social History Narrative  . Not on file     Family History: The patient's family history includes CAD in her father; Cancer (age of onset: 32) in her mother; Heart attack in her father, maternal grandfather, maternal grandmother, paternal grandfather, and paternal grandmother.  ROS:   Please see the history of present illness.    Positive for palpitations and chest pressure.  All other systems reviewed and are negative.  EKGs/Labs/Other Studies Reviewed:    The following studies were reviewed today: TEE September 21, 2013: Study Conclusions  - Left ventricle: The cavity size was normal. Wall thickness was normal. Systolic function was normal. The estimated ejection fraction was in the range of 60% to 65%. Wall motion was normal; there were no regional wall motion abnormalities. - Aortic valve: There was no stenosis. - Mitral valve: Trivial regurgitation. - Left atrium: No evidence of thrombus in the atrial cavity or appendage. - Right ventricle: The cavity size was normal. Systolic function was normal. - Right atrium: No evidence of thrombus in the atrial cavity or appendage. - Atrial septum: There was a moderate-sized PFO present with bubbles crossing right to left at rest.  GXT 02-09-2018: Study Highlights     Blood pressure demonstrated a hypertensive response to exercise.  There was no ST segment deviation noted during stress.  No EKG criterial of ischemia. Occasional PVCs noted.  Normal exercise capacity achieving 11.4 mets at an adequate heart rate response of 100% MPHR.  This is a low risk study   MRI Brain 04-11-2017: IMPRESSION: 1. No acute intracranial abnormality. 2. Old left cerebellar infarct, unchanged. 3. Developmentally diminutive right vertebral artery. No dissection, aneurysm or other acute vascular abnormality.  EKG:  EKG is not ordered today.    Recent Labs: 04/11/2017: ALT 14 01/20/2018: BUN 16; Creatinine, Ser 0.95; Hemoglobin 13.7; Platelets 292; Potassium 4.2; Sodium 140; TSH 1.252  Recent Lipid Panel    Component Value Date/Time   CHOL 163 01/27/2018 0902   TRIG 61 01/27/2018 0902  HDL 60 01/27/2018 0902   CHOLHDL 2.7 01/27/2018 0902   LDLCALC 91 01/27/2018 0902    Physical Exam:    VS:  BP 92/62   Pulse 62   Ht 5\' 9"  (1.753 m)   Wt 184 lb 1.9 oz (83.5 kg)   SpO2 99%   BMI 27.19 kg/m     Wt Readings from Last 3 Encounters:  03/08/18 184 lb 1.9 oz (83.5 kg)  02/25/18 184 lb 9.6 oz (83.7 kg)  01/27/18 177 lb (80.3 kg)     GEN:  Well nourished, well developed in no acute distress HEENT: Normal NECK: No JVD; No carotid bruits LYMPHATICS: No lymphadenopathy CARDIAC: RRR, no murmurs, rubs, gallops RESPIRATORY:  Clear to auscultation without rales, wheezing or rhonchi  ABDOMEN: Soft, non-tender, non-distended MUSCULOSKELETAL:  No edema; No deformity  SKIN: Warm and dry NEUROLOGIC:  Alert and oriented x 3 PSYCHIATRIC:  Normal affect   ASSESSMENT:    1. PFO (patent foramen ovale)    PLAN:    In order of problems listed above:  1. I reviewed the patient's echo and TEE images today.  I agree in the TEE interpretation at the patient has at least a moderate sized PFO with significant right to left shunting at rest.  I do not appreciate an  associated atrial septal aneurysm.  The patient's defect is anatomically suitable for transcatheter closure.  We reviewed available data demonstrating benefit for transcatheter closure in young patients with cryptogenic stroke today.  The patient would like to proceed with transcatheter closure at some point.  However, she feels debilitated from her PVCs at present.  She is had modest improvement from propranolol but continues to be very symptomatic.  I stressed to her that PFO closure would not impact her heart palpitations.  In fact, there is about a 3% risk of atrial fibrillation following transcatheter PFO closure.  I discussed with her the expected recovery, potential benefits, and other risks associated with transcatheter closure.  She understands the risks of serious complications such as cardiac perforation, myocardial infarction, stroke, or device embolization occur at a very low risk of less than 1%.  The patient does not have a nickel allergy.  I think transcatheter closure at her young age with radiographic evidence of a prior stroke is certainly a reasonable treatment option.  At this time she would like to wait to try to get her PVCs under better control, but she will be in touch if she decides to move forward with scheduling transcatheter PFO closure.  Medication Adjustments/Labs and Tests Ordered: Current medicines are reviewed at length with the patient today.  Concerns regarding medicines are outlined above.  No orders of the defined types were placed in this encounter.  No orders of the defined types were placed in this encounter.   Patient Instructions  Call us if you decide to proceed with PFO closure. Ask for Dr. Antionette Char nurse, Valetta Fuller.    Signed, Sherren Mocha, MD  03/08/2018 4:46 PM    [place]

## 2018-03-08 NOTE — Patient Instructions (Addendum)
Call us if you decide to proceed with PFO closure. Ask for Dr. Antionette Char nurse, Valetta Fuller.

## 2018-03-17 ENCOUNTER — Telehealth: Payer: Self-pay

## 2018-03-17 DIAGNOSIS — Q2112 Patent foramen ovale: Secondary | ICD-10-CM

## 2018-03-17 DIAGNOSIS — Q211 Atrial septal defect: Secondary | ICD-10-CM

## 2018-03-17 MED FILL — ZONISAMIDE 50 MG CAPSULE: 50 | 30 days supply | Qty: 90 | Fill #1

## 2018-03-17 NOTE — Telephone Encounter (Signed)
Confirmed with patient PFO closure scheduled 04/16/18.  Arrival time: 0730 for 0930 case She will have pre-procedure labs drawn at Dr. Lysbeth Penner office visit 12/20.  Will confirm with Dr. Burt Knack that patient is to START PLAVIX 75 mg daily 5 days prior to procedure. If so, will need to be called in to [facility].  Will release instruction letter to MyChart next week. The patient will respond to ensure she has received message.  She was grateful for call and agrees with treatment plan.

## 2018-03-22 NOTE — Telephone Encounter (Signed)
Per Dr. Burt Knack, the patient is to START PLAVIX 75 mg daily 4-5 days prior to closure.  Will call patient later this week to confirm and send instruction letter.

## 2018-03-25 ENCOUNTER — Encounter: Payer: Self-pay | Admitting: *Deleted

## 2018-03-25 MED ORDER — CLOPIDOGREL BISULFATE 75 MG PO TABS: ORAL_TABLET | ORAL | 1 refills | Status: DC

## 2018-03-25 MED FILL — CLOPIDOGREL 75 MG TABLET: 75 | 90 days supply | Qty: 90 | Fill #0

## 2018-03-25 NOTE — Telephone Encounter (Signed)
LMTCB to discuss instructions with patient 

## 2018-03-25 NOTE — Telephone Encounter (Addendum)
I discussed instructions with patient, she will start Plavix 75 mg daily on 04/11/18, go to the [facility] 04/09/18  for pre-procedure BMP/CBC, I will send a copy of procedure instructions to her through [facility].

## 2018-04-08 ENCOUNTER — Telehealth: Payer: Self-pay | Admitting: Cardiovascular Disease

## 2018-04-08 NOTE — Telephone Encounter (Signed)
New message    Pt is calling asking for a call back about her surgery next week.

## 2018-04-08 NOTE — Telephone Encounter (Signed)
Christina Goodman states she is having issues with [employer] getting FMLA for upcoming PFO closure scheduled 12/27.   She understands she will be called early next week to discuss whether or not she would like to proceed with closure.

## 2018-04-09 ENCOUNTER — Ambulatory Visit: Payer: 59 | Admitting: Internal Medicine

## 2018-04-09 ENCOUNTER — Encounter: Payer: Self-pay | Admitting: Internal Medicine

## 2018-04-09 VITALS — BP 116/66 | HR 61 | Ht 69.0 in | Wt 181.4 lb

## 2018-04-09 DIAGNOSIS — I493 Ventricular premature depolarization: Secondary | ICD-10-CM

## 2018-04-09 DIAGNOSIS — E782 Mixed hyperlipidemia: Secondary | ICD-10-CM | POA: Diagnosis not present

## 2018-04-09 DIAGNOSIS — Q211 Atrial septal defect: Secondary | ICD-10-CM

## 2018-04-09 DIAGNOSIS — Q2112 Patent foramen ovale: Secondary | ICD-10-CM

## 2018-04-09 NOTE — Progress Notes (Signed)
OFFICE CONSULT NOTE  Chief Complaint:  Follow-up palpitations  Primary Care Physician: Patient, No Pcp Per  HPI:  Christina Goodman is a 47 y.o. female who is being seen today for the evaluation of palpitations, chest pressure at the request of No ref. provider found.  This is a 47 year old female who recently was seen in the emergency department on October 2 after being awakened with severe palpitations.  She says she has this very regularly.  Recently they have been more significant.  A few weeks ago she had upper respiratory infection and was on some cough medication.  It was originally attributed to that however has persisted.  Overall she seems fairly healthy other has history of migraine headaches which is been difficult to control does see a headache specialist Dr. Ruthann Cancer, and he is on zonisamide 150 mg at night.  She also apparently receives steroid injections.  She had a history of a stroke by imaging only in 2011.  She subsequently was found by echo and TEE to have a PFO.  At the time there was no data regarding closure of PFO for either stroke or migraine benefit.  Subsequently trials have not demonstrated benefit in reducing migraines however in patients earlier than age 42 there is a stroke reduction benefit with PFO closure.  That being said, she is done well on aspirin clinically since that time.  She also reports chest pressure.  This happens mostly associated with the episodes of palpitations.  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  There is a strong family history of heart disease in her father who had his first heart attack in his 47s, although, she said she was a smoker.  02/25/2018  Christina Goodman returns today for follow-up of palpitations.  She underwent exercise treadmill stress testing which was negative for ischemia but did show hypertensive response for exercise.  She did wear a monitor which indicated  sinus rhythm with frequent isolated PVCs that occurred sometimes in bigeminy and trigeminy with a burden of PVCs of 12.6% which is actually quite frequent.  She seems quite upset today.  She says she feels like her heart is very "full in her chest".  She said she can hardly tolerate this anymore.  She is also bothered by more frequent migraine headaches.  She is interested in possible closure of her PFO which was not approved previously however the data now is much clearer in patients who have had prior stroke or TIA with known PFO.  04/09/2018  Christina Goodman is seen today in follow-up.  Overall she is doing well.  She did say that she may have had some side effects from her Lipitor.  She started it and had some muscle pains.  She stopped it for about 5 days and her symptoms improved then she was instructed to take it at night.  She seems to be tolerating it better at night and I do think it is beneficial to lower her cholesterol further to reach goal LDL less than 70.  She has had marked improvement on her palpitations with propranolol.  Recently she was seen by Dr. Burt Knack and they had a discussion about PFO closure.  He felt that she was a good candidate for that and has a procedure scheduled for December 27.   PMHx:  Past Medical History:  Diagnosis Date  . Abrasion of right leg 04/07/2017  . Dental crowns present   . History  of CVA (cerebrovascular accident) 2011   no deficits  . Migraine   . Migraines   . PFO (patent foramen ovale)   . PONV (postoperative nausea and vomiting)   . Proximal phalanx fracture of finger 03/2017   left small  . Runny nose 04/07/2017   clear drainage, per pt.    Past Surgical History:  Procedure Laterality Date  . CESAREAN SECTION  04/25/2009  . CLOSED REDUCTION FINGER WITH PERCUTANEOUS PINNING Left 04/09/2017   Procedure: LEFT SMALL FINGER CLOSED REDUCTION WITH PERCUTANEOUS PINNING;  Surgeon: Leanora Cover, MD;  Location: [facility];  Service:  Orthopedics;  Laterality: Left;  Marland Kitchen MASTOIDECTOMY    . TEE WITHOUT CARDIOVERSION N/A 08/24/2013   Procedure: TRANSESOPHAGEAL ECHOCARDIOGRAM (TEE);  Surgeon: Larey Dresser, MD;  Location: [facility];  Service: Cardiovascular;  Laterality: N/A;  . TMJ ARTHROPLASTY    . TYMPANOSTOMY TUBE PLACEMENT Bilateral     FAMHx:  Family History  Problem Relation Age of Onset  . Cancer Mother 72  . CAD Father   . Heart attack Father   . Heart attack Paternal Grandfather   . Heart attack Paternal Grandmother   . Heart attack Maternal Grandfather   . Heart attack Maternal Grandmother     SOCHx:   reports that she has never smoked. She has never used smokeless tobacco. She reports that she does not drink alcohol or use drugs.  ALLERGIES:  Allergies  Allergen Reactions  . Ivp Dye [Iodinated Diagnostic Agents] Hives  . Penicillins Hives    Childhood Has patient had a PCN reaction causing immediate rash, facial/tongue/throat swelling, SOB or lightheadedness with hypotension: No Has patient had a PCN reaction causing severe rash involving mucus membranes or skin necrosis: Yes Has patient had a PCN reaction that required hospitalization: No Has patient had a PCN reaction occurring within the last 10 years: No If all of the above answers are "NO", then may proceed with Cephalosporin use.   . Sulfa Antibiotics Hives  . Tape Other (See Comments)    BLOOD BLISTERS    ROS: Pertinent items noted in HPI and remainder of comprehensive ROS otherwise negative.  HOME MEDS: Current Outpatient Medications on File Prior to Visit  Medication Sig Dispense Refill  . aspirin 81 MG tablet Take 162 mg by mouth daily. Take 2 low dose asprin daily    . atorvastatin (LIPITOR) 20 MG tablet Take 1 tablet (20 mg total) by mouth daily. 90 tablet 3  . Coenzyme Q10 (CO Q 10 PO) Take 1 tablet by mouth daily.    Marland Kitchen MAGNESIUM PO Take 1 tablet by mouth daily.    . metFORMIN (GLUCOPHAGE) 500 MG tablet Take 500 mg by mouth  2 (two) times daily with a meal.    . propranolol ER (INDERAL LA) 80 MG 24 hr capsule Take 1 capsule (80 mg total) by mouth daily. 90 capsule 3  . ZONISAMIDE PO Take 150 mg by mouth at bedtime.   1  . clopidogrel (PLAVIX) 75 MG tablet Starting 04/11/18 take one tablet by mouth daily. 90 tablet 1   No current facility-administered medications on file prior to visit.     LABS/IMAGING: No results found for this or any previous visit (from the past 48 hour(s)). No results found.  LIPID PANEL:    Component Value Date/Time   CHOL 163 01/27/2018 0902   TRIG 61 01/27/2018 0902   HDL 60 01/27/2018 0902   CHOLHDL 2.7 01/27/2018 0902   LDLCALC 91 01/27/2018  0813    WEIGHTS: Wt Readings from Last 3 Encounters:  04/09/18 181 lb 6.4 oz (82.3 kg)  03/08/18 184 lb 1.9 oz (83.5 kg)  02/25/18 184 lb 9.6 oz (83.7 kg)    VITALS: BP 116/66   Pulse 61   Ht 5\' 9"  (1.753 m)   Wt 181 lb 6.4 oz (82.3 kg)   LMP 03/30/2018   SpO2 98%   BMI 26.79 kg/m   EXAM: Deferred  EKG: Deferred  ASSESSMENT: 1. Palpitations -frequent PVCs (burden 12.6%) 2. Chest pressure -low risk exercise treadmill stress test (01/2018) 3. Anxiety 4. History of migraines 5. History of stroke 6. PFO (not previously closed)  PLAN: 1.   Christina Goodman has had symptomatic improvement in her palpitations on propranolol.  She did have some side effects on Lipitor but is now taking at night and seems to be tolerating it.  I did offer to switch her statin however she wants to continue to try it.  We will need to reassess her numbers in the spring.  She is planning to have PFO closure with Dr. Burt Knack on December 27.  We will obtain preprocedural labs today.  Follow-up with me in 6 months.  Pixie Casino, MD, [facility] [place], [place] Director of the Advanced Lipid Disorders &  Cardiovascular Risk Reduction Clinic Diplomate of the American Board of Clinical Lipidology Attending Cardiologist  Direct  Dial: [phone]  Fax: [phone]  Website:  [email or web address] 04/09/2018, 2:05 PM

## 2018-04-09 NOTE — Patient Instructions (Signed)
Medication Instructions:  NO CHANGES If you need a refill on your cardiac medications before your next appointment, please call your pharmacy.   Lab work: BMET, CBC today If you have labs (blood work) drawn today and your tests are completely normal, you will receive your results only by: Marland Kitchen MyChart Message (if you have MyChart) OR . A paper copy in the mail If you have any lab test that is abnormal or we need to change your treatment, we will call you to review the results.  Testing/Procedures: NONE  Follow-Up: At [facility], you and your health needs are our priority.  As part of our continuing mission to provide you with exceptional heart care, we have created designated Provider Care Teams.  These Care Teams include your primary Cardiologist (physician) and Advanced Practice Providers (APPs -  Physician Assistants and Nurse Practitioners) who all work together to provide you with the care you need, when you need it. You will need a follow up appointment in 6 months.  Please call our office 2 months in advance to schedule this appointment.  You may see Pixie Casino, MD or one of the following Advanced Practice Providers on your designated Care Team: [place], [place] . Fabian Sharp, PA-C  Any Other Special Instructions Will Be Listed Below (If Applicable).

## 2018-04-09 NOTE — H&P (View-Only) (Signed)
OFFICE CONSULT NOTE  Chief Complaint:  Follow-up palpitations  Primary Care Physician: Patient, No Pcp Per  HPI:  Christina Goodman is a 47 y.o. female who is being seen today for the evaluation of palpitations, chest pressure at the request of No ref. provider found.  This is a 47 year old female who recently was seen in the emergency department on October 2 after being awakened with severe palpitations.  She says she has this very regularly.  Recently they have been more significant.  A few weeks ago she had upper respiratory infection and was on some cough medication.  It was originally attributed to that however has persisted.  Overall she seems fairly healthy other has history of migraine headaches which is been difficult to control does see a headache specialist Dr. Ruthann Cancer, and he is on zonisamide 150 mg at night.  She also apparently receives steroid injections.  She had a history of a stroke by imaging only in 2011.  She subsequently was found by echo and TEE to have a PFO.  At the time there was no data regarding closure of PFO for either stroke or migraine benefit.  Subsequently trials have not demonstrated benefit in reducing migraines however in patients earlier than age 57 there is a stroke reduction benefit with PFO closure.  That being said, she is done well on aspirin clinically since that time.  She also reports chest pressure.  This happens mostly associated with the episodes of palpitations.  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  There is a strong family history of heart disease in her father who had his first heart attack in his 5s, although, she said she was a smoker.  02/25/2018  Christina Goodman returns today for follow-up of palpitations.  She underwent exercise treadmill stress testing which was negative for ischemia but did show hypertensive response for exercise.  She did wear a monitor which indicated  sinus rhythm with frequent isolated PVCs that occurred sometimes in bigeminy and trigeminy with a burden of PVCs of 12.6% which is actually quite frequent.  She seems quite upset today.  She says she feels like her heart is very "full in her chest".  She said she can hardly tolerate this anymore.  She is also bothered by more frequent migraine headaches.  She is interested in possible closure of her PFO which was not approved previously however the data now is much clearer in patients who have had prior stroke or TIA with known PFO.  04/09/2018  Christina Goodman is seen today in follow-up.  Overall she is doing well.  She did say that she may have had some side effects from her Lipitor.  She started it and had some muscle pains.  She stopped it for about 5 days and her symptoms improved then she was instructed to take it at night.  She seems to be tolerating it better at night and I do think it is beneficial to lower her cholesterol further to reach goal LDL less than 70.  She has had marked improvement on her palpitations with propranolol.  Recently she was seen by Dr. Burt Knack and they had a discussion about PFO closure.  He felt that she was a good candidate for that and has a procedure scheduled for December 27.   PMHx:  Past Medical History:  Diagnosis Date  . Abrasion of right leg 04/07/2017  . Dental crowns present   . History  of CVA (cerebrovascular accident) 2011   no deficits  . Migraine   . Migraines   . PFO (patent foramen ovale)   . PONV (postoperative nausea and vomiting)   . Proximal phalanx fracture of finger 03/2017   left small  . Runny nose 04/07/2017   clear drainage, per pt.    Past Surgical History:  Procedure Laterality Date  . CESAREAN SECTION  04/25/2009  . CLOSED REDUCTION FINGER WITH PERCUTANEOUS PINNING Left 04/09/2017   Procedure: LEFT SMALL FINGER CLOSED REDUCTION WITH PERCUTANEOUS PINNING;  Surgeon: Leanora Cover, MD;  Location: [facility];  Service:  Orthopedics;  Laterality: Left;  Marland Kitchen MASTOIDECTOMY    . TEE WITHOUT CARDIOVERSION N/A 08/24/2013   Procedure: TRANSESOPHAGEAL ECHOCARDIOGRAM (TEE);  Surgeon: Larey Dresser, MD;  Location: [facility];  Service: Cardiovascular;  Laterality: N/A;  . TMJ ARTHROPLASTY    . TYMPANOSTOMY TUBE PLACEMENT Bilateral     FAMHx:  Family History  Problem Relation Age of Onset  . Cancer Mother 42  . CAD Father   . Heart attack Father   . Heart attack Paternal Grandfather   . Heart attack Paternal Grandmother   . Heart attack Maternal Grandfather   . Heart attack Maternal Grandmother     SOCHx:   reports that she has never smoked. She has never used smokeless tobacco. She reports that she does not drink alcohol or use drugs.  ALLERGIES:  Allergies  Allergen Reactions  . Ivp Dye [Iodinated Diagnostic Agents] Hives  . Penicillins Hives    Childhood Has patient had a PCN reaction causing immediate rash, facial/tongue/throat swelling, SOB or lightheadedness with hypotension: No Has patient had a PCN reaction causing severe rash involving mucus membranes or skin necrosis: Yes Has patient had a PCN reaction that required hospitalization: No Has patient had a PCN reaction occurring within the last 10 years: No If all of the above answers are "NO", then may proceed with Cephalosporin use.   . Sulfa Antibiotics Hives  . Tape Other (See Comments)    BLOOD BLISTERS    ROS: Pertinent items noted in HPI and remainder of comprehensive ROS otherwise negative.  HOME MEDS: Current Outpatient Medications on File Prior to Visit  Medication Sig Dispense Refill  . aspirin 81 MG tablet Take 162 mg by mouth daily. Take 2 low dose asprin daily    . atorvastatin (LIPITOR) 20 MG tablet Take 1 tablet (20 mg total) by mouth daily. 90 tablet 3  . Coenzyme Q10 (CO Q 10 PO) Take 1 tablet by mouth daily.    Marland Kitchen MAGNESIUM PO Take 1 tablet by mouth daily.    . metFORMIN (GLUCOPHAGE) 500 MG tablet Take 500 mg by mouth  2 (two) times daily with a meal.    . propranolol ER (INDERAL LA) 80 MG 24 hr capsule Take 1 capsule (80 mg total) by mouth daily. 90 capsule 3  . ZONISAMIDE PO Take 150 mg by mouth at bedtime.   1  . clopidogrel (PLAVIX) 75 MG tablet Starting 04/11/18 take one tablet by mouth daily. 90 tablet 1   No current facility-administered medications on file prior to visit.     LABS/IMAGING: No results found for this or any previous visit (from the past 48 hour(s)). No results found.  LIPID PANEL:    Component Value Date/Time   CHOL 163 01/27/2018 0902   TRIG 61 01/27/2018 0902   HDL 60 01/27/2018 0902   CHOLHDL 2.7 01/27/2018 0902   LDLCALC 91 01/27/2018  0177    WEIGHTS: Wt Readings from Last 3 Encounters:  04/09/18 181 lb 6.4 oz (82.3 kg)  03/08/18 184 lb 1.9 oz (83.5 kg)  02/25/18 184 lb 9.6 oz (83.7 kg)    VITALS: BP 116/66   Pulse 61   Ht 5\' 9"  (1.753 m)   Wt 181 lb 6.4 oz (82.3 kg)   LMP 03/30/2018   SpO2 98%   BMI 26.79 kg/m   EXAM: Deferred  EKG: Deferred  ASSESSMENT: 1. Palpitations -frequent PVCs (burden 12.6%) 2. Chest pressure -low risk exercise treadmill stress test (01/2018) 3. Anxiety 4. History of migraines 5. History of stroke 6. PFO (not previously closed)  PLAN: 1.   Christina Goodman has had symptomatic improvement in her palpitations on propranolol.  She did have some side effects on Lipitor but is now taking at night and seems to be tolerating it.  I did offer to switch her statin however she wants to continue to try it.  We will need to reassess her numbers in the spring.  She is planning to have PFO closure with Dr. Burt Knack on December 27.  We will obtain preprocedural labs today.  Follow-up with me in 6 months.  Pixie Casino, MD, [facility], [place] Director of the Advanced Lipid Disorders &  Cardiovascular Risk Reduction Clinic Diplomate of the American Board of Clinical Lipidology Attending Cardiologist  Direct  Dial: [phone]  Fax: [phone]  Website:  [email or web address] 04/09/2018, 2:05 PM

## 2018-04-10 LAB — BASIC METABOLIC PANEL
BUN/Creatinine Ratio: 15 (ref 9–23)
BUN: 15 mg/dL (ref 6–24)
CALCIUM: 9.6 mg/dL (ref 8.7–10.2)
CHLORIDE: 107 mmol/L — AB (ref 96–106)
CO2: 20 mmol/L (ref 20–29)
Creatinine, Ser: 1.01 mg/dL — ABNORMAL HIGH (ref 0.57–1.00)
GFR, EST AFRICAN AMERICAN: 78 mL/min/{1.73_m2} (ref >59)
GFR, EST NON AFRICAN AMERICAN: 67 mL/min/{1.73_m2} (ref >59)
Glucose: 108 mg/dL — ABNORMAL HIGH (ref 65–99)
POTASSIUM: 4.2 mmol/L (ref 3.5–5.2)
SODIUM: 142 mmol/L (ref 134–144)

## 2018-04-10 LAB — CBC WITH DIFFERENTIAL/PLATELET
BASOS: 0 %
Basophils Absolute: 0 10*3/uL (ref 0.0–0.2)
EOS (ABSOLUTE): 0.1 10*3/uL (ref 0.0–0.4)
Eos: 2 %
Hematocrit: 39 % (ref 34.0–46.6)
Hemoglobin: 13.1 g/dL (ref 11.1–15.9)
Immature Grans (Abs): 0 10*3/uL (ref 0.0–0.1)
Immature Granulocytes: 0 %
Lymphocytes Absolute: 2.5 10*3/uL (ref 0.7–3.1)
Lymphs: 30 %
MCH: 29.8 pg (ref 26.6–33.0)
MCHC: 33.6 g/dL (ref 31.5–35.7)
MCV: 89 fL (ref 79–97)
Monocytes Absolute: 0.7 10*3/uL (ref 0.1–0.9)
Monocytes: 9 %
Neutrophils Absolute: 4.8 10*3/uL (ref 1.4–7.0)
Neutrophils: 59 %
Platelets: 287 10*3/uL (ref 150–450)
RBC: 4.4 x10E6/uL (ref 3.77–5.28)
RDW: 12.1 % — ABNORMAL LOW (ref 12.3–15.4)
WBC: 8.1 10*3/uL (ref 3.4–10.8)

## 2018-04-12 NOTE — Telephone Encounter (Signed)
Left message to call back  

## 2018-04-13 NOTE — Telephone Encounter (Signed)
See MyChart messages.

## 2018-04-15 ENCOUNTER — Telehealth: Payer: Self-pay | Admitting: *Deleted

## 2018-04-15 NOTE — Telephone Encounter (Signed)
Follow up  ° ° °Patient is returning your call. °

## 2018-04-15 NOTE — Telephone Encounter (Signed)
Spoke with patient who does want to move forward with procedure as scheduled.  She is also asking about when she returns to work, she was put on the schedule for about 6 hours of overtime and is on that Friday night.  She is wanting to know if this it OK.  Advised I would assume the return to work would be for her normal, standard amount of work but unsure about overtime.  Pt aware I will forward this questions to [facility] and Dr Burt Knack for review and advisement.

## 2018-04-15 NOTE — Telephone Encounter (Signed)
Attempted to contact patient as requested to follow up on whether she would like to proceed with procedure as scheduled for tomorrow.  Left message for pt to call back to verify.

## 2018-04-16 ENCOUNTER — Encounter (HOSPITAL_COMMUNITY)
Admission: RE | Disposition: A | Discharge status: Home / Self Care | Payer: Self-pay | Source: Home / Self Care | Attending: Cardiovascular Disease

## 2018-04-16 ENCOUNTER — Inpatient Hospital Stay (HOSPITAL_BASED_OUTPATIENT_CLINIC_OR_DEPARTMENT_OTHER): Payer: 59

## 2018-04-16 ENCOUNTER — Other Ambulatory Visit: Payer: Self-pay

## 2018-04-16 ENCOUNTER — Ambulatory Visit (HOSPITAL_COMMUNITY)
Admission: RE | Admit: 2018-04-16 | Discharge: 2018-04-16 | Disposition: A | Discharge status: Home / Self Care | Payer: 59 | Attending: Cardiovascular Disease | Admitting: Cardiovascular Disease

## 2018-04-16 DIAGNOSIS — Z7982 Long term (current) use of aspirin: Secondary | ICD-10-CM | POA: Insufficient documentation

## 2018-04-16 DIAGNOSIS — Z880 Allergy status to penicillin: Secondary | ICD-10-CM | POA: Insufficient documentation

## 2018-04-16 DIAGNOSIS — Z91041 Radiographic dye allergy status: Secondary | ICD-10-CM | POA: Diagnosis not present

## 2018-04-16 DIAGNOSIS — I252 Old myocardial infarction: Secondary | ICD-10-CM | POA: Diagnosis not present

## 2018-04-16 DIAGNOSIS — Z882 Allergy status to sulfonamides status: Secondary | ICD-10-CM | POA: Diagnosis not present

## 2018-04-16 DIAGNOSIS — I10 Essential (primary) hypertension: Secondary | ICD-10-CM | POA: Insufficient documentation

## 2018-04-16 DIAGNOSIS — Z7984 Long term (current) use of oral hypoglycemic drugs: Secondary | ICD-10-CM | POA: Diagnosis not present

## 2018-04-16 DIAGNOSIS — Q211 Atrial septal defect: Secondary | ICD-10-CM | POA: Insufficient documentation

## 2018-04-16 DIAGNOSIS — Z888 Allergy status to other drugs, medicaments and biological substances status: Secondary | ICD-10-CM | POA: Insufficient documentation

## 2018-04-16 DIAGNOSIS — Z8249 Family history of ischemic heart disease and other diseases of the circulatory system: Secondary | ICD-10-CM | POA: Diagnosis not present

## 2018-04-16 DIAGNOSIS — I253 Aneurysm of heart: Secondary | ICD-10-CM | POA: Diagnosis not present

## 2018-04-16 DIAGNOSIS — Z79899 Other long term (current) drug therapy: Secondary | ICD-10-CM | POA: Insufficient documentation

## 2018-04-16 DIAGNOSIS — I493 Ventricular premature depolarization: Secondary | ICD-10-CM | POA: Insufficient documentation

## 2018-04-16 DIAGNOSIS — G43909 Migraine, unspecified, not intractable, without status migrainosus: Secondary | ICD-10-CM | POA: Diagnosis not present

## 2018-04-16 DIAGNOSIS — Q2112 Patent foramen ovale: Secondary | ICD-10-CM

## 2018-04-16 DIAGNOSIS — Z8673 Personal history of transient ischemic attack (TIA), and cerebral infarction without residual deficits: Secondary | ICD-10-CM | POA: Diagnosis not present

## 2018-04-16 HISTORY — PX: PATENT FORAMEN OVALE(PFO) CLOSURE: CATH118300

## 2018-04-16 LAB — POCT ACTIVATED CLOTTING TIME
ACTIVATED CLOTTING TIME: 175 s
Activated Clotting Time: 213 seconds
Activated Clotting Time: 224 seconds

## 2018-04-16 LAB — ECHOCARDIOGRAM COMPLETE
HEIGHTINCHES: 69 in
Weight: 2880 oz

## 2018-04-16 LAB — PREGNANCY, URINE: Preg Test, Ur: NEGATIVE

## 2018-04-16 SURGERY — PATENT FORAMEN OVALE (PFO) CLOSURE
Anesthesia: LOCAL

## 2018-04-16 MED ORDER — DIPHENHYDRAMINE HCL 50 MG/ML IJ SOLN
INTRAMUSCULAR | Status: AC
  Filled 2018-04-16: qty 1

## 2018-04-16 MED ORDER — VANCOMYCIN HCL IN DEXTROSE 1-5 GM/200ML-% IV SOLN
1000.0000 mg | INTRAVENOUS | Status: AC
  Administered 2018-04-16: 1000 mg via INTRAVENOUS

## 2018-04-16 MED ORDER — SODIUM CHLORIDE 0.9 % WEIGHT BASED INFUSION
3.0000 mL/kg/h | INTRAVENOUS | Status: AC
  Administered 2018-04-16: 3 mL/kg/h via INTRAVENOUS

## 2018-04-16 MED ORDER — SODIUM CHLORIDE 0.9 % WEIGHT BASED INFUSION
1.0000 mL/kg/h | INTRAVENOUS | Status: DC
  Administered 2018-04-16: 12.243 mL/kg/h via INTRAVENOUS

## 2018-04-16 MED ORDER — CLOPIDOGREL BISULFATE 75 MG PO TABS: 75.0000 mg | ORAL_TABLET | ORAL | Status: DC

## 2018-04-16 MED ORDER — SODIUM CHLORIDE 0.9% FLUSH: 3.0000 mL | INTRAVENOUS | Status: DC | PRN

## 2018-04-16 MED ORDER — ASPIRIN 81 MG PO CHEW: 81.0000 mg | CHEWABLE_TABLET | ORAL | Status: DC

## 2018-04-16 MED ORDER — MIDAZOLAM HCL 2 MG/2ML IJ SOLN
INTRAMUSCULAR | Status: AC
  Filled 2018-04-16: qty 2

## 2018-04-16 MED ORDER — SODIUM CHLORIDE 0.9 % IV SOLN: 250.0000 mL | INTRAVENOUS | Status: DC | PRN

## 2018-04-16 MED ORDER — LIDOCAINE HCL (PF) 1 % IJ SOLN
INTRAMUSCULAR | Status: AC
  Filled 2018-04-16: qty 30

## 2018-04-16 MED ORDER — HEPARIN (PORCINE) IN NACL 1000-0.9 UT/500ML-% IV SOLN
INTRAVENOUS | Status: DC | PRN
  Administered 2018-04-16: 500 mL

## 2018-04-16 MED ORDER — FENTANYL CITRATE (PF) 100 MCG/2ML IJ SOLN
INTRAMUSCULAR | Status: DC | PRN
  Administered 2018-04-16 (×4): 25 ug via INTRAVENOUS

## 2018-04-16 MED ORDER — SODIUM CHLORIDE 0.9% FLUSH: 3.0000 mL | Freq: Two times a day (BID) | INTRAVENOUS | Status: DC

## 2018-04-16 MED ORDER — HEPARIN (PORCINE) IN NACL 1000-0.9 UT/500ML-% IV SOLN
INTRAVENOUS | Status: AC
  Filled 2018-04-16: qty 500

## 2018-04-16 MED ORDER — ACETAMINOPHEN 325 MG PO TABS: 650.0000 mg | ORAL_TABLET | ORAL | Status: DC | PRN

## 2018-04-16 MED ORDER — LIDOCAINE HCL (PF) 1 % IJ SOLN
INTRAMUSCULAR | Status: DC | PRN
  Administered 2018-04-16: 18 mL

## 2018-04-16 MED ORDER — VANCOMYCIN HCL IN DEXTROSE 1-5 GM/200ML-% IV SOLN
INTRAVENOUS | Status: AC
  Filled 2018-04-16: qty 200

## 2018-04-16 MED ORDER — SODIUM CHLORIDE 0.9 % WEIGHT BASED INFUSION: 1.0000 mL/kg/h | INTRAVENOUS | Status: DC

## 2018-04-16 MED ORDER — DIAZEPAM 5 MG PO TABS: 5.0000 mg | ORAL_TABLET | ORAL | Status: DC | PRN

## 2018-04-16 MED ORDER — HEPARIN SODIUM (PORCINE) 1000 UNIT/ML IJ SOLN
INTRAMUSCULAR | Status: DC | PRN
  Administered 2018-04-16: 1000 [IU] via INTRAVENOUS
  Administered 2018-04-16: 6000 [IU] via INTRAVENOUS

## 2018-04-16 MED ORDER — FENTANYL CITRATE (PF) 100 MCG/2ML IJ SOLN
INTRAMUSCULAR | Status: AC
  Filled 2018-04-16: qty 2

## 2018-04-16 MED ORDER — DIPHENHYDRAMINE HCL 50 MG/ML IJ SOLN
50.0000 mg | Freq: Once | INTRAMUSCULAR | Status: AC
  Administered 2018-04-16: 50 mg via INTRAVENOUS

## 2018-04-16 MED ORDER — ONDANSETRON HCL 4 MG/2ML IJ SOLN: 4.0000 mg | Freq: Four times a day (QID) | INTRAMUSCULAR | Status: DC | PRN

## 2018-04-16 MED ORDER — HEPARIN SODIUM (PORCINE) 1000 UNIT/ML IJ SOLN
INTRAMUSCULAR | Status: AC
  Filled 2018-04-16: qty 1

## 2018-04-16 MED ORDER — MIDAZOLAM HCL 2 MG/2ML IJ SOLN
INTRAMUSCULAR | Status: DC | PRN
  Administered 2018-04-16: 2 mg via INTRAVENOUS
  Administered 2018-04-16: 1 mg via INTRAVENOUS
  Administered 2018-04-16: 2 mg via INTRAVENOUS
  Administered 2018-04-16: 1 mg via INTRAVENOUS

## 2018-04-16 MED ORDER — OXYCODONE-ACETAMINOPHEN 5-325 MG PO TABS: 1.0000 | ORAL_TABLET | Freq: Once | ORAL | Status: DC

## 2018-04-16 MED ORDER — OXYCODONE HCL 5 MG PO TABS
5.0000 mg | ORAL_TABLET | ORAL | Status: DC | PRN
  Administered 2018-04-16: 10 mg via ORAL
  Filled 2018-04-16: qty 2

## 2018-04-16 SURGICAL SUPPLY — 16 items
CATH ACUNAV 8FR 90CM (CATHETERS) ×1 IMPLANT
CATH SUPER TORQUE PLUS 6F MPA1 (CATHETERS) ×1 IMPLANT
COVER SWIFTLINK CONNECTOR (BAG) ×1 IMPLANT
GUIDEWIRE AMPLATZER 1.5JX260 (WIRE) ×1 IMPLANT
KIT HEART LEFT (KITS) ×2 IMPLANT
KIT PV (KITS) ×1 IMPLANT
OCCLUDER AMPLATZER PFO 18MM (Prosthesis & Implant Heart) ×1 IMPLANT
PACK CARDIAC CATHETERIZATION (CUSTOM PROCEDURE TRAY) ×2 IMPLANT
SHEATH INTROD W/O MIN 9FR 25CM (SHEATH) ×1 IMPLANT
SHEATH PINNACLE 8F 10CM (SHEATH) ×1 IMPLANT
SHEATH PROBE COVER 6X72 (BAG) ×1 IMPLANT
SYS DELIVER AMP TREVISIO 8FR (SHEATH) ×2
SYSTEM DELIVER AMP TREVIS 8FR (SHEATH) IMPLANT
TRANSDUCER W/STOPCOCK (MISCELLANEOUS) ×2 IMPLANT
TUBING CIL FLEX 10 FLL-RA (TUBING) ×2 IMPLANT
WIRE EMERALD 3MM-J .035X150CM (WIRE) ×1 IMPLANT

## 2018-04-16 NOTE — Interval H&P Note (Signed)
History and Physical Interval Note:  04/16/2018 9:19 AM  Christina Goodman  has presented today for surgery, with the diagnosis of pfo  The various methods of treatment have been discussed with the patient and family. After consideration of risks, benefits and other options for treatment, the patient has consented to  Procedure(s): PATENT FORAMEN OVALE (PFO) CLOSURE (N/A) as a surgical intervention .  The patient's history has been reviewed, patient examined, no change in status, stable for surgery.  I have reviewed the patient's chart and labs.  Questions were answered to the patient's satisfaction.     Sherren Mocha

## 2018-04-16 NOTE — Progress Notes (Signed)
Site area: rt groin 8 fr 9 fr venous sheathes Site Prior to Removal:  Level 0 Pressure Applied For:20 minutes Manual: yes   Patient Status During Pull:  awake Post Pull Site:  Level 0 Post Pull Instructions Given:  yes Post Pull Pulses Present:  Dressing Applied:  yes Bedrest begins @ 11:35 Comments: no hematoma no bleeding

## 2018-04-16 NOTE — Progress Notes (Signed)
  Echocardiogram 2D Echocardiogram has been performed.  Christina Goodman T Colbi Schiltz 04/16/2018, 3:54 PM

## 2018-04-16 NOTE — Progress Notes (Signed)
Following procedure patient was complaining of itching around her hair and neck. No rash or hives present only redness from scratching. Dr Burt Knack notified

## 2018-04-16 NOTE — Discharge Instructions (Signed)
Patent Foramen Ovale Closure, Adult, Care After  °This sheet gives you information about how to care for yourself after your procedure. Your health care provider may also give you more specific instructions. If you have problems or questions, contact your health care provider. °What can I expect after the procedure? °After the procedure, it is common to have: °· Bruising. °· Tenderness in the area where the long, thin tube was inserted into your inner thigh or groin area (catheter insertion site). °Follow these instructions at home: °Catheter insertion site care ° °· Follow instructions from your health care provider about how to take care of your incision or puncture. Make sure you: °? Wash your hands with soap and water before you change your bandage (dressing). If soap and water are not available, use hand sanitizer. °? Change your dressing as told by your health care provider. °? Leave stitches (sutures), skin glue, or adhesive strips in place. These skin closures may need to stay in place for 2 weeks or longer. If adhesive strip edges start to loosen and curl up, you may trim the loose edges. Do not remove adhesive strips completely unless your health care provider tells you to do that. °· Check your catheter insertion site every day for signs of infection. Check for: °? Redness, swelling, or pain. °? Fluid or blood. °? Warmth. °? Pus or a bad smell. °? A lump or bump. °Activity °· Do not lift anything that is heavier than 10 lb (4.5 kg), or the limit that your health care provider tells you, until he or she says that it is safe. °· Return to your normal activities as told by your health care provider. Ask your health care provider what activities are safe for you. °· Do not drive until your health care provider approves. °Medicines °· Take over-the-counter and prescription medicines only as told by your health care provider. °· You may need to take medicines to prevent blood clots for 6 months or longer. You  may have to take aspirin and clopidogrel. Clopidogrel is a blood thinner (anticoagulant) that helps to prevent heart attacks and strokes. °Lifestyle °· Avoid drinking alcohol. °· Do not use any products that contain nicotine or tobacco, such as cigarettes and e-cigarettes. If you need help quitting, ask your health care provider. °General instructions °· Drink enough fluid to keep your urine clear or pale yellow. This helps to get rid of the dye that was used during the procedure. °· Tell all health care providers and dental care providers who care for you that you had a patent foramen ovale closure. Do this before having any type of test or surgery. °· Ask your health care provider if you need to take antibiotic medicine before dental procedures and surgeries. This may be necessary to prevent infection. °· While taking anticoagulants: °? Prevent falls by removing loose rugs and extension cords from areas where you walk. °? Be very careful when using knives, scissors, or other sharp objects. °? Do not play contact sports or participate in other activities that have a high risk of injury. °· Do not take baths, swim, or use a hot tub until your health care provider approves. °· Keep all follow-up visits as told by your health care provider. This is important. °Contact a health care provider if: °· You have a fever. °· You have pain that does not get better with medicine. °· You have fluid or blood coming from your catheter insertion site. °· You have a hard lump   or bump at your catheter insertion site. °Get help right away if: °· You have trouble breathing. °· You have chest pain. °· You have redness, swelling, or pain around your catheter insertion site. °· Your catheter insertion site feels warm to the touch. °· You have pus or a bad smell coming from your catheter insertion site. °· You have severe pain in your arm or jaw. °Summary °· After the procedure, it is common for you to have some bruising and tenderness  where a tube was inserted into your inner thigh or groin area (catheter insertion site). °· Check your catheter insertion site every day for signs of infection, such as redness, swelling, or pain. °· Before any procedure or test, tell all health care providers and dental providers who care for you that you had a patent foramen ovale closure. °This information is not intended to replace advice given to you by your health care provider. Make sure you discuss any questions you have with your health care provider. °Document Released: 05/08/2016 Document Revised: 05/08/2016 Document Reviewed: 05/08/2016 °Elsevier Interactive Patient Education © 2019 Elsevier Inc. ° °

## 2018-04-19 ENCOUNTER — Encounter (HOSPITAL_COMMUNITY): Payer: Self-pay | Admitting: Cardiovascular Disease

## 2018-04-22 ENCOUNTER — Encounter: Payer: Self-pay | Admitting: Physician Assistant

## 2018-04-22 NOTE — Telephone Encounter (Signed)
Dr. Burt Knack personally spoke with patient at time of procedure 12/27.

## 2018-04-26 MED FILL — ZONISAMIDE 50 MG CAPSULE: 50 | 30 days supply | Qty: 90 | Fill #2

## 2018-05-07 DIAGNOSIS — G43719 Chronic migraine without aura, intractable, without status migrainosus: Secondary | ICD-10-CM | POA: Diagnosis not present

## 2018-05-19 ENCOUNTER — Ambulatory Visit: Payer: 59 | Admitting: Physician Assistant

## 2018-05-19 ENCOUNTER — Encounter: Payer: Self-pay | Admitting: Physician Assistant

## 2018-05-19 VITALS — BP 123/84 | HR 74 | Ht 69.0 in | Wt 189.0 lb

## 2018-05-19 DIAGNOSIS — Z8774 Personal history of (corrected) congenital malformations of heart and circulatory system: Secondary | ICD-10-CM | POA: Diagnosis not present

## 2018-05-19 DIAGNOSIS — R079 Chest pain, unspecified: Secondary | ICD-10-CM

## 2018-05-19 MED ORDER — CLINDAMYCIN HCL 300 MG PO CAPS: ORAL_CAPSULE | ORAL | 3 refills | Status: DC

## 2018-05-19 MED FILL — CLINDAMYCIN HCL 300 MG CAPS: 300 | 1 days supply | Qty: 2 | Fill #0

## 2018-05-19 NOTE — Progress Notes (Signed)
[facility]                                       Cardiology Office Note    Date:  05/19/2018   ID:  Christina Goodman, DOB March 06, 1971, MRN [id number]  PCP:  Patient, No Pcp Per  Cardiologist: Dr. Debara Pickett   CC: 1 month s/p PFO closure   History of Present Illness:  Christina Goodman is a 48 y.o. female with a history of chronic migraines, symptomatic palpitations on propranolol, cryptogenic stroke and PFO s/p PFO closure who presents to clinic for 1 month follow up.   The patient was seen back in 2015 after an MRI of the brain demonstrated an old cerebellar infarction.  She underwent an echo study that demonstrated right to left shunting and a TEE was performed.  This demonstrated a moderate sized PFO with spontaneous right to left shunting at rest based on agitated saline study.  At the time we elected to treat her medically.  She was then referred back to Dr. Burt Knack in 02/2018 for consideration of PFO closure given more recent data that has demonstrated benefit in patients with cryptogenic stroke with respect to recurrent stroke risk.  She has also been followed by Dr. Debara Pickett for symptomatic palpitations and chest pain with low risk ETT in 01/2018.  She underwent successful PFO closure using an 18 mm Amplatzer PFO occluder. Post operative echo showed good device placement with no shunt. She was discharged home the same day on aspirin and plavix.    Today she presents to clinic for follow up. She was have sharp stabbing pains under her left breast that felt like a knife right after a procedure about 2x day for 5 days after the procedure that self resolved. This past she has noted exertional chest pressure occasionally associated with nausea. She even noticed it once today when she was pushing the echo machine. It is relieved with rest. It's not every time she exerts herself, just sometimes. No LE edema, orthopnea or PND. No dizziness or syncope. No  blood in stool or urine.      Past Medical History:  Diagnosis Date  . Abrasion of right leg 04/07/2017  . Dental crowns present   . History of CVA (cerebrovascular accident) 2011   no deficits  . Migraine   . Migraines   . PFO (patent foramen ovale)   . PONV (postoperative nausea and vomiting)   . Proximal phalanx fracture of finger 03/2017   left small  . Runny nose 04/07/2017   clear drainage, per pt.    Past Surgical History:  Procedure Laterality Date  . CESAREAN SECTION  04/25/2009  . CLOSED REDUCTION FINGER WITH PERCUTANEOUS PINNING Left 04/09/2017   Procedure: LEFT SMALL FINGER CLOSED REDUCTION WITH PERCUTANEOUS PINNING;  Surgeon: Leanora Cover, MD;  Location: [facility];  Service: Orthopedics;  Laterality: Left;  Marland Kitchen MASTOIDECTOMY    . PATENT FORAMEN OVALE(PFO) CLOSURE N/A 04/16/2018   Procedure: PATENT FORAMEN OVALE (PFO) CLOSURE;  Surgeon: Sherren Mocha, MD;  Location: [facility];  Service: Cardiovascular;  Laterality: N/A;  . TEE WITHOUT CARDIOVERSION N/A 08/24/2013   Procedure: TRANSESOPHAGEAL ECHOCARDIOGRAM (TEE);  Surgeon: Larey Dresser, MD;  Location: [facility];  Service: Cardiovascular;  Laterality: N/A;  . TMJ ARTHROPLASTY    . TYMPANOSTOMY TUBE PLACEMENT Bilateral  Current Medications: Outpatient Medications Prior to Visit  Medication Sig Dispense Refill  . aspirin 81 MG tablet Take 81 mg by mouth daily. Take 2 low dose asprin daily    . Coenzyme Q10 (CO Q 10 PO) Take 1 tablet by mouth daily.    Marland Kitchen MAGNESIUM PO Take 500 mg by mouth daily.     . metFORMIN (GLUCOPHAGE) 500 MG tablet Take 500 mg by mouth 2 (two) times daily with a meal.    . propranolol ER (INDERAL LA) 80 MG 24 hr capsule Take 1 capsule (80 mg total) by mouth daily. 90 capsule 3  . ZONISAMIDE PO Take 150 mg by mouth at bedtime.   1  . clopidogrel (PLAVIX) 75 MG tablet Starting 04/11/18 take one tablet by mouth daily. 90 tablet 1  . atorvastatin (LIPITOR) 20 MG  tablet Take 1 tablet (20 mg total) by mouth daily. 90 tablet 3   No facility-administered medications prior to visit.      Allergies:   Ivp dye [iodinated diagnostic agents]; Penicillins; Sulfa antibiotics; and Tape   Social History   Socioeconomic History  . Marital status: Single    Spouse name: Not on file  . Number of children: 0  . Years of education: Not on file  . Highest education level: Not on file  Occupational History  . Occupation: ultrasound Engineer, production: [employer]  . Financial resource strain: Not on file  . Food insecurity:    Worry: Not on file    Inability: Not on file  . Transportation needs:    Medical: Not on file    Non-medical: Not on file  Tobacco Use  . Smoking status: Never Smoker  . Smokeless tobacco: Never Used  Substance and Sexual Activity  . Alcohol use: No    Comment: occasionally  . Drug use: No  . Sexual activity: Yes  Lifestyle  . Physical activity:    Days per week: Not on file    Minutes per session: Not on file  . Stress: Not on file  Relationships  . Social connections:    Talks on phone: Not on file    Gets together: Not on file    Attends religious service: Not on file    Active member of club or organization: Not on file    Attends meetings of clubs or organizations: Not on file    Relationship status: Not on file  Other Topics Concern  . Not on file  Social History Narrative  . Not on file     Family History:  The patient's family history includes CAD in her father; Cancer (age of onset: 48) in her mother; Heart attack in her father, maternal grandfather, maternal grandmother, paternal grandfather, and paternal grandmother.      ROS:   Please see the history of present illness.    ROS All other systems reviewed and are negative.   PHYSICAL EXAM:   VS:  BP 123/84   Pulse 74   Ht 5\' 9"  (1.753 m)   Wt 189 lb (85.7 kg)   SpO2 99%   BMI 27.91 kg/m    GEN: Well nourished, well developed, in no  acute distress HEENT: normal Neck: no JVD or masses Cardiac: RRR; no murmurs, rubs, or gallops,no edema  Respiratory:  clear to auscultation bilaterally, normal work of breathing GI: soft, nontender, nondistended, + BS MS: no deformity or atrophy Skin: warm and dry, no rash Neuro:  Alert and  Oriented x 3, Strength and sensation are intact Psych: euthymic mood, full affect   Wt Readings from Last 3 Encounters:  05/19/18 189 lb (85.7 kg)  04/16/18 180 lb (81.6 kg)  04/09/18 181 lb 6.4 oz (82.3 kg)      Studies/Labs Reviewed:   EKG:  EKG is NOT ordered today.    Recent Labs: 01/20/2018: TSH 1.252 04/09/2018: BUN 15; Creatinine, Ser 1.01; Hemoglobin 13.1; Platelets 287; Potassium 4.2; Sodium 142   Lipid Panel    Component Value Date/Time   CHOL 163 01/27/2018 0902   TRIG 61 01/27/2018 0902   HDL 60 01/27/2018 0902   CHOLHDL 2.7 01/27/2018 0902   LDLCALC 91 01/27/2018 0902    Additional studies/ records that were reviewed today include:  04/16/18 PATENT FORAMEN OVALE (PFO) CLOSURE  Conclusion   SUCCESSFUL TRANSCATHETER PFO CLOSURE USING AN 18 MM AMPLATZER PFO OCCLUDER WITH FLUOROSCOPIC AND INTRACARDIAC ECHO GUIDANCE   _____________   Echo 04/16/18 Study Conclusions - Left ventricle: The cavity size was normal. Wall thickness was   increased in a pattern of mild LVH. Systolic function was   vigorous. The estimated ejection fraction was in the range of 65%   to 70%. Wall motion was normal; there were no regional wall   motion abnormalities. Left ventricular diastolic function   parameters were normal. - Mitral valve: Mildly thickened leaflets . There was trivial   regurgitation. - Left atrium: The atrium was normal in size. - Atrial septum: Mass overlying the atrial septum consistent with   ASD/PFO closure device. No PFO by color doppler. - Inferior vena cava: The vessel was normal in size. The   respirophasic diameter changes were in the normal range (>= 50%),    consistent with normal central venous pressure. Impressions: - Compared to a prior study in 2015, the LVEF is higher. There is   now an Amplatzer closure device which appears stable overlying   the atrial septum. No atrial level shunting is noted by color   doppler.    ASSESSMENT & PLAN:   PFO s/p PFO closure: she is having a lot of chest pain that is atypical.  She has had sharp stabbing pains as well as exertional chest pressure.  Neither chest pain is consistent and comes on sporadically. Will update limited echo to assess for pericardial effusion given recent PFO closure.  Of note, she did have a low risk ETT in 01/2018.  She has very few risk factors for CAD except for a family history of early coronary disease in her father.   Discussed case with Dr. Burt Knack and plan is to do a stress echo if she continues to have chest pressure.  Continue aspirin and Plavix (I have asked her to decrease aspirin from 162 mg to 81 mg daily).  She will discontinue Plavix after 3 months of therapy.  SBE prophylaxis discussed and clindamycin called in.  She understands this is only for 6 months post closure.  Medication Adjustments/Labs and Tests Ordered: Current medicines are reviewed at length with the patient today.  Concerns regarding medicines are outlined above.  Medication changes, Labs and Tests ordered today are listed in the Patient Instructions below. Patient Instructions  Medication Instructions:  Your physician has recommended you make the following change in your medication:   1. DECREASE: aspirin to 81 mg once a day  2. You may stop clopidogrel (plavix) after 07/16/18  .3. FOR 6 MONTHS ONLY: Your physician discussed the importance of taking an antibiotic prior to  any dental, gastrointestinal, genitourinary procedures to prevent damage to the heart valves from infection. You were given a prescription for an antibiotic (Clindamycin) based on current SBE prophylaxis guidelines.  If you need a  refill on your cardiac medications before your next appointment, please call your pharmacy.   Lab work: None Ordered  If you have labs (blood work) drawn today and your tests are completely normal, you will receive your results only by: Marland Kitchen MyChart Message (if you have MyChart) OR . A paper copy in the mail If you have any lab test that is abnormal or we need to change your treatment, we will call you to review the results.  Testing/Procedures: Your physician has requested that you have a limited echocardiogram on 05/20/18 at 2:00 PM. Please arrive at 1:30 PM. Echocardiography is a painless test that uses sound waves to create images of your heart. It provides your doctor with information about the size and shape of your heart and how well your heart's chambers and valves are working. This procedure takes approximately one hour. There are no restrictions for this procedure.  Your physician has requested that you have an echocardiogram with bubble study on 04/27/19 at 2:00 PM. Please arrive at 1:30 PM. Echocardiography is a painless test that uses sound waves to create images of your heart. It provides your doctor with information about the size and shape of your heart and how well your heart's chambers and valves are working. This procedure takes approximately one hour. There are no restrictions for this procedure.  Follow-Up:  Follow up with Nell Range, PA on 04/27/19 at 3:00 PM, right after your echocardiogram.  Any Other Special Instructions Will Be Listed Below (If Applicable).       Signed, Angelena Form, PA-C  05/19/2018 10:21 PM    [facility] [address], [place], [place]  [address] Phone: [phone]; Fax: [phone]

## 2018-05-19 NOTE — Patient Instructions (Addendum)
Medication Instructions:  Your physician has recommended you make the following change in your medication:   1. DECREASE: aspirin to 81 mg once a day  2. You may stop clopidogrel (plavix) after 07/16/18  .3. FOR 6 MONTHS ONLY: Your physician discussed the importance of taking an antibiotic prior to any dental, gastrointestinal, genitourinary procedures to prevent damage to the heart valves from infection. You were given a prescription for an antibiotic (Clindamycin) based on current SBE prophylaxis guidelines.  If you need a refill on your cardiac medications before your next appointment, please call your pharmacy.   Lab work: None Ordered  If you have labs (blood work) drawn today and your tests are completely normal, you will receive your results only by: Marland Kitchen MyChart Message (if you have MyChart) OR . A paper copy in the mail If you have any lab test that is abnormal or we need to change your treatment, we will call you to review the results.  Testing/Procedures: Your physician has requested that you have a limited echocardiogram on 05/20/18 at 2:00 PM. Please arrive at 1:30 PM. Echocardiography is a painless test that uses sound waves to create images of your heart. It provides your doctor with information about the size and shape of your heart and how well your heart's chambers and valves are working. This procedure takes approximately one hour. There are no restrictions for this procedure.  Your physician has requested that you have an echocardiogram with bubble study on 04/27/19 at 2:00 PM. Please arrive at 1:30 PM. Echocardiography is a painless test that uses sound waves to create images of your heart. It provides your doctor with information about the size and shape of your heart and how well your heart's chambers and valves are working. This procedure takes approximately one hour. There are no restrictions for this procedure.  Follow-Up:  Follow up with Nell Range, PA on 04/27/19 at  3:00 PM, right after your echocardiogram.  Any Other Special Instructions Will Be Listed Below (If Applicable).

## 2018-05-20 ENCOUNTER — Ambulatory Visit (HOSPITAL_COMMUNITY): Payer: 59 | Attending: Cardiovascular Disease

## 2018-05-20 DIAGNOSIS — R079 Chest pain, unspecified: Secondary | ICD-10-CM | POA: Insufficient documentation

## 2018-06-02 NOTE — Addendum Note (Signed)
Addended by: Drue Novel I on: 06/02/2018 01:58 PM   Modules accepted: Orders

## 2018-06-07 MED FILL — ZONISAMIDE 50 MG CAPSULE: 50 | 30 days supply | Qty: 90 | Fill #3

## 2018-06-07 MED FILL — metFORMIN HCL ER 500 MG TB2: 500 | 180 days supply | Qty: 180 | Fill #1

## 2018-06-07 MED FILL — PROPRANOLOL ER 80 MG CAP: 80 | 90 days supply | Qty: 90 | Fill #1

## 2018-06-17 ENCOUNTER — Ambulatory Visit (INDEPENDENT_AMBULATORY_CARE_PROVIDER_SITE_OTHER): Payer: Self-pay | Admitting: Physician Assistant

## 2018-06-17 VITALS — BP 100/70 | HR 80 | Temp 98.2°F | Resp 16 | Wt 183.4 lb

## 2018-06-17 DIAGNOSIS — J069 Acute upper respiratory infection, unspecified: Secondary | ICD-10-CM

## 2018-06-17 DIAGNOSIS — J029 Acute pharyngitis, unspecified: Secondary | ICD-10-CM

## 2018-06-17 LAB — POCT RAPID STREP A (OFFICE): Rapid Strep A Screen: NEGATIVE

## 2018-06-17 MED ORDER — CETIRIZINE HCL 10 MG PO TABS: 10.0000 mg | ORAL_TABLET | Freq: Every day | ORAL | 0 refills | Status: DC

## 2018-06-17 MED ORDER — FLUTICASONE PROPIONATE 50 MCG/ACT NA SUSP: 2.0000 | Freq: Every day | NASAL | 0 refills | Status: DC

## 2018-06-17 MED ORDER — SALINE SPRAY 0.65 % NA SOLN: 1.0000 | NASAL | 0 refills | Status: DC | PRN

## 2018-06-17 MED FILL — FLUTICASONE PROP 50 MCG SPR: 50 | 30 days supply | Qty: 16 | Fill #0

## 2018-06-17 NOTE — Patient Instructions (Signed)
Sore Throat  -Your strep test is negative. We do not obtain strep cultures in our office.  -I recommend symptomatic treatment with flonase, zyrtec, and tylenol.  - You may take Tylenol as prescribed for sore throat.  - You may also use 2 tablespoons of warm honey every 4-6 hours to coat and soothe your throat. - Drink plenty of water, at least 64 ounces daily and rest to make sure your body has a chance to get better. - Follow up with family doctor if no improvement with treatment plan. Seek care sooner if symptoms worsen or you develop new concerning symptoms.   When you have a sore throat, your throat may feel:  Tender.  Burning.  Irritated.  Scratchy.  Painful when you swallow.  Painful when you talk. Many things can cause a sore throat, such as:  An infection.  Allergies.  Dry air.  Smoke or pollution.  Radiation treatment.  Gastroesophageal reflux disease (GERD).  A tumor. A sore throat can be the first sign of another sickness. It can happen with other problems, like:  Coughing.  Sneezing.  Fever.  Swelling in the neck. Most sore throats go away without treatment. Follow these instructions at home:      Take over-the-counter medicines only as told by your doctor. ? If your child has a sore throat, do not give your child aspirin.  Drink enough fluids to keep your pee (urine) pale yellow.  Rest when you feel you need to.  To help with pain: ? Sip warm liquids, such as broth, herbal tea, or warm water. ? Eat or drink cold or frozen liquids, such as frozen ice pops. ? Gargle with a salt-water mixture 3-4 times a day or as needed. To make a salt-water mixture, add -1 tsp (3-6 g) of salt to 1 cup (237 mL) of warm water. Mix it until you cannot see the salt anymore. ? Suck on hard candy or throat lozenges. ? Put a cool-mist humidifier in your bedroom at night. ? Sit in the bathroom with the door closed for 5-10 minutes while you run hot water in the  shower.  Do not use any products that contain nicotine or tobacco, such as cigarettes, e-cigarettes, and chewing tobacco. If you need help quitting, ask your doctor.  Wash your hands well and often with soap and water. If soap and water are not available, use hand sanitizer. Contact a doctor if:  You have a fever for more than 2-3 days.  You keep having symptoms for more than 2-3 days.  Your throat does not get better in 7 days.  You have a fever and your symptoms suddenly get worse.  Your child who is 3 months to 45 years old has a temperature of 102.88F (39C) or higher. Get help right away if:  You have trouble breathing.  You cannot swallow fluids, soft foods, or your saliva.  You have swelling in your throat or neck that gets worse.  You keep feeling sick to your stomach (nauseous).  You keep throwing up (vomiting). Summary  A sore throat is pain, burning, irritation, or scratchiness in the throat. Many things can cause a sore throat.  Take over-the-counter medicines only as told by your doctor. Do not give your child aspirin.  Drink plenty of fluids, and rest as needed.  Contact a doctor if your symptoms get worse or your sore throat does not get better within 7 days. This information is not intended to replace advice given to  you by your health care provider. Make sure you discuss any questions you have with your health care provider. Document Released: 01/15/2008 Document Revised: 09/07/2017 Document Reviewed: 09/07/2017 Elsevier Interactive Patient Education  2019 [employer].

## 2018-06-17 NOTE — Progress Notes (Signed)
MRN: [id number] DOB: 18-Sep-1970  Subjective:   Christina Goodman is a 48 y.o. female presenting for chief complaint of Sore Throat (x 2 days); Ear Pain (started 10 min ago, left ear ); and Headache (started today ) .  Reports 1 day history of nasal congestion, runny nose, post nasal drainage, sore throat, left ear fullness, and mild headache. Post nasal drainage made her vomit a little today.  Has tried tylenol with some relief. Denies fever, sinus pain, inability to swallow, voice change, dry cough, productive cough, wheezing, shortness of breath, chest tightness, chest pain and myalgia, night sweats, chills, fatigue, malaise, nausea, abdominal pain and diarrhea. No known sick contact exposure. PMH of CVA, PFO (recently closed 03/2018), migraines, and TMJ. Notes that the mild headache she is having today is nothing compared to the migraines she typically has as the migraines are much more painful and aggravating.  Denies hx of seasonal allerigies and asthma. Patient has had flu shot this season. Denies smoking. She is requesting a test for strep because she was supposed to be in court this morning but had to miss due to her sx.  Cannot tolerate sudafed due to her hx.  Denies any other aggravating or relieving factors, no other questions or concerns.  Review of Systems  Constitutional: Negative for diaphoresis.  Eyes: Negative for blurred vision and double vision.  Respiratory: Negative for stridor.   Musculoskeletal: Negative for neck pain.  Skin: Negative for rash.  Neurological: Negative for dizziness, tingling and weakness.    Emer has a current medication list which includes the following prescription(s): acetaminophen, aspirin, coenzyme q10, magnesium, metformin, propranolol er, zonisamide, and clindamycin. Also is allergic to ivp dye [iodinated diagnostic agents]; penicillins; sulfa antibiotics; and tape.  Christina Goodman  has a past medical history of Abrasion of right leg (04/07/2017), Dental crowns  present, History of CVA (cerebrovascular accident) (2011), Migraine, Migraines, PFO (patent foramen ovale), PONV (postoperative nausea and vomiting), Proximal phalanx fracture of finger (03/2017), and Runny nose (04/07/2017). Also  has a past surgical history that includes Tympanostomy tube placement (Bilateral); Mastoidectomy; TMJ Arthroplasty; TEE without cardioversion (N/A, 08/24/2013); Cesarean section (04/25/2009); Closed reduction finger with percutaneous pinning (Left, 04/09/2017); and PATENT FORAMEN OVALE(PFO) CLOSURE (N/A, 04/16/2018).   Objective:   Vitals: BP 100/70 (BP Location: Right Arm, Patient Position: Sitting, Cuff Size: Normal)   Pulse 80   Temp 98.2 F (36.8 C) (Oral)   Resp 16   Wt 183 lb 6.4 oz (83.2 kg)   SpO2 100%   BMI 27.08 kg/m   Physical Exam Vitals signs reviewed.  Constitutional:      General: She is not in acute distress.    Appearance: She is well-developed. She is not ill-appearing or toxic-appearing.  HENT:     Head: Normocephalic and atraumatic.     Comments: No pain with palpation of b/l temporal regions.     Right Ear: Ear canal and external ear normal. A middle ear effusion is present. Tympanic membrane is not erythematous or bulging.     Left Ear: Ear canal and external ear normal. A middle ear effusion is present. Tympanic membrane is not erythematous or bulging.     Nose: Mucosal edema, congestion and rhinorrhea present. Rhinorrhea is clear.     Right Sinus: No maxillary sinus tenderness or frontal sinus tenderness.     Left Sinus: No maxillary sinus tenderness or frontal sinus tenderness.     Mouth/Throat:     Lips: Pink.     Mouth: Mucous  membranes are moist.     Pharynx: Uvula midline. Posterior oropharyngeal erythema (posterior oropharynx with erythema) present. No pharyngeal swelling, oropharyngeal exudate or uvula swelling.     Tonsils: Swelling: 0 on the right. 0 on the left.     Comments: S/p tonsillectomy.  Eyes:     Extraocular  Movements: Extraocular movements intact.     Conjunctiva/sclera: Conjunctivae normal.     Pupils: Pupils are equal, round, and reactive to light.  Neck:     Musculoskeletal: Full passive range of motion without pain and normal range of motion. No edema or neck rigidity.     Trachea: Phonation normal. No abnormal tracheal secretions.  Cardiovascular:     Rate and Rhythm: Normal rate and regular rhythm.     Heart sounds: Normal heart sounds.  Pulmonary:     Effort: Pulmonary effort is normal.     Breath sounds: Normal breath sounds. No decreased breath sounds, wheezing, rhonchi or rales.  Lymphadenopathy:     Head:     Right side of head: No submental, submandibular, tonsillar, preauricular, posterior auricular or occipital adenopathy.     Left side of head: No submental, submandibular, tonsillar, preauricular, posterior auricular or occipital adenopathy.     Cervical: No cervical adenopathy.     Upper Body:     Right upper body: No supraclavicular adenopathy.     Left upper body: No supraclavicular adenopathy.  Skin:    General: Skin is warm and dry.  Neurological:     Mental Status: She is alert.     Cranial Nerves: Cranial nerves are intact.     Sensory: Sensation is intact.     Coordination: Finger-Nose-Finger Test and Heel to [place] Test normal.     Gait: Gait is intact.     Comments: Strength of b/l upper and lower extremities 5/5.     Results for orders placed or performed in visit on 06/17/18 (from the past 24 hour(s))  POCT rapid strep A     Status: Normal   Collection Time: 06/17/18  9:39 AM  Result Value Ref Range   Rapid Strep A Screen Negative Negative    Assessment and Plan :  1. Viral URI Pt is overall well appearing, NAD. VSS. POC strep test negative. No nuchal rigidity, peritonsillar erythema or edema, drooling, stridor, hot potato voice, or signs of respiratory distress noted on exam to suggest more concerning etiology of sore throat such as epiglottitis,  peritonsillar abscess, retropharyngeal abscess. In terms of headache, patient denied that this was the worst headache of her life. Headache today is non concerning for [facility], ICH, Meningitis, or temporal arteritis. Pt is afebrile with no focal neuro deficits, nuchal rigidity, or visual disturbance. Would rec symptomatic treatment at this time. Rest, oral hydration, light meals. May use flonsae, zyrtec,nasal saline, OTC tylenol, and warm tea with honey and lemon. Discussed red flags in details. Given strict return/ED precautions. Pt voices understanding.  - fluticasone (FLONASE) 50 MCG/ACT nasal spray; Place 2 sprays into both nostrils daily.  Dispense: 16 g; Refill: 0 - cetirizine (ZYRTEC) 10 MG tablet; Take 1 tablet (10 mg total) by mouth daily.  Dispense: 20 tablet; Refill: 0 - sodium chloride (OCEAN) 0.65 % SOLN nasal spray; Place 1 spray into both nostrils as needed.  Dispense: 60 mL; Refill: 0  2. Sore throat - POCT rapid strep A   Tenna Delaine, PA-C  [facility] 06/17/2018 9:40 AM

## 2018-06-18 DIAGNOSIS — J111 Influenza due to unidentified influenza virus with other respiratory manifestations: Secondary | ICD-10-CM | POA: Diagnosis not present

## 2018-06-18 MED FILL — OSELTAMIVIR PHOSPHATE 75 MG: 75 | 5 days supply | Qty: 10 | Fill #0

## 2018-06-18 MED FILL — AZITHROMYCIN 250 MG TABLET: 250 | 5 days supply | Qty: 6 | Fill #0

## 2018-06-21 ENCOUNTER — Telehealth: Payer: Self-pay

## 2018-06-21 NOTE — Telephone Encounter (Signed)
Patient did not answered the phone 

## 2018-07-12 MED FILL — ZONISAMIDE 50 MG CAPSULE: 50 | 30 days supply | Qty: 90 | Fill #0

## 2018-08-05 ENCOUNTER — Telehealth: Payer: 59 | Admitting: Physician Assistant

## 2018-08-05 DIAGNOSIS — Z20822 Contact with and (suspected) exposure to covid-19: Secondary | ICD-10-CM

## 2018-08-05 DIAGNOSIS — R6889 Other general symptoms and signs: Secondary | ICD-10-CM | POA: Diagnosis not present

## 2018-08-05 DIAGNOSIS — J111 Influenza due to unidentified influenza virus with other respiratory manifestations: Secondary | ICD-10-CM | POA: Diagnosis not present

## 2018-08-05 NOTE — Progress Notes (Signed)
I have spent 5 minutes in review of e-visit questionnaire, review and updating patient chart, medical decision making and response to patient.   Zev Blue Cody Cleophas Yoak, PA-C    

## 2018-08-05 NOTE — Progress Notes (Signed)
E-Visit for [employer] Virus Screening  My Concern is that your COVID test is potentially a false negative which can happen in up to 30% cases depending on what test is performed. You have all of the symptoms. I would recommend that you follow the supportive measures below. I would start Benadryl or Claritin if the rash becomes more itchy. This can be from viral illness itself. Make sure to keep cool. Monitor breathing closely. You will need to be seen ASAP if anything is worsening.  Continue quarantine until at least 7 days have passed since symptom onset, symptoms are resolved and no fever for 72 hours without tylenol.  Coronavirus disease 2019 (COVID-19) is a respiratory illness that can spread from person to person. The virus that causes COVID-19 is a new virus that was first identified in the country of [place] but is now found in multiple other countries and has spread to the [place].  Symptoms associated with the virus are mild to severe fever, cough, and shortness of breath. There is currently no vaccine to protect against COVID-19, and there is no specific antiviral treatment for the virus.   To be considered HIGH RISK for Coronavirus (COVID-19), you have to meet the following criteria:  . Traveled to [place], [place], [place], [place] or [place]; or in the [place] to [place], [place], [employer], or [place]; and have fever, cough, and shortness of breath within the last 2 weeks of travel OR  . Been in close contact with a person diagnosed with COVID-19 within the last 2 weeks and have fever, cough, and shortness of breath  . IF YOU DO NOT MEET THESE CRITERIA, YOU ARE CONSIDERED LOW RISK FOR COVID-19.   It is vitally important that if you feel that you have an infection such as this virus or any other virus that you stay home and away from places where you may spread it to others.  You should self-quarantine for 14 days if you have symptoms that could potentially be coronavirus and avoid  contact with people age 51 and older.   You can use medication such as Delsym for cough.  You may also take acetaminophen (Tylenol) as needed for fever.   Reduce your risk of any infection by using the same precautions used for avoiding the common cold or flu:  Marland Kitchen Wash your hands often with soap and warm water for at least 20 seconds.  If soap and water are not readily available, use an alcohol-based hand sanitizer with at least 60% alcohol.  . If coughing or sneezing, cover your mouth and nose by coughing or sneezing into the elbow areas of your shirt or coat, into a tissue or into your sleeve (not your hands). . Avoid shaking hands with others and consider head nods or verbal greetings only. . Avoid touching your eyes, nose, or mouth with unwashed hands.  . Avoid close contact with people who are sick. . Avoid places or events with large numbers of people in one location, like concerts or sporting events. . Carefully consider travel plans you have or are making. . If you are planning any travel outside or inside the [place], visit the CDC's Travelers' Health webpage for the latest health notices. . If you have some symptoms but not all symptoms, continue to monitor at home and seek medical attention if your symptoms worsen. . If you are having a medical emergency, call 911.  HOME CARE . Only take medications as instructed by your medical  team. . Drink plenty of fluids and get plenty of rest. . A steam or ultrasonic humidifier can help if you have congestion.   GET HELP RIGHT AWAY IF: . You develop worsening fever. . You become short of breath . You cough up blood. . Your symptoms become more severe MAKE SURE YOU   Understand these instructions.  Will watch your condition.  Will get help right away if you are not doing well or get worse.  Your e-visit answers were reviewed by a board certified advanced clinical practitioner to complete your personal care plan.  Depending on the  condition, your plan could have included both over the counter or prescription medications.  If there is a problem please reply once you have received a response from your provider. Your safety is important to [place].  If you have drug allergies check your prescription carefully.    You can use MyChart to ask questions about today's visit, request a non-urgent call back, or ask for a work or school excuse for 24 hours related to this e-Visit. If it has been greater than 24 hours you will need to follow up with your provider, or enter a new e-Visit to address those concerns. You will get an e-mail in the next two days asking about your experience.  I hope that your e-visit has been valuable and will speed your recovery. Thank you for using e-visits.

## 2018-08-18 ENCOUNTER — Telehealth: Payer: 59 | Admitting: Family

## 2018-08-18 DIAGNOSIS — R197 Diarrhea, unspecified: Secondary | ICD-10-CM

## 2018-08-18 MED ORDER — ONDANSETRON HCL 4 MG PO TABS: 4.0000 mg | ORAL_TABLET | Freq: Three times a day (TID) | ORAL | 0 refills | Status: DC | PRN

## 2018-08-18 MED FILL — ONDANSETRON HCL 4 MG TABLET: 4 | 6 days supply | Qty: 20 | Fill #0

## 2018-08-18 NOTE — Progress Notes (Signed)
We are sorry that you are not feeling well.  Here is how we plan to help!  Based on what you have shared with me it looks like you have Acute Infectious Diarrhea.  Most cases of acute diarrhea are due to infections with virus and bacteria and are self-limited conditions lasting less than 14 days.  For your symptoms you may take Imodium 2 mg tablets that are over the counter at your local pharmacy. Take two tablet now and then one after each loose stool up to 6 a day.  Antibiotics are not needed for most people with diarrhea.  Zofran 4 mg 1 tablet every 8 hours as needed for nausea and vomiting.   If your diarrhea worsens and does not improve, or you are unable to keep fluids down you will need to be seen face to face to be evaluated.      HOME CARE  We recommend changing your diet to help with your symptoms for the next few days.  Drink plenty of fluids that contain water salt and sugar. Sports drinks such as Gatorade may help.   You may try broths, soups, bananas, applesauce, soft breads, mashed potatoes or crackers.   You are considered infectious for as long as the diarrhea continues. Hand washing or use of alcohol based hand sanitizers is recommend.  It is best to stay out of work or school until your symptoms stop.   GET HELP RIGHT AWAY  If you have dark yellow colored urine or do not pass urine frequently you should drink more fluids.    If your symptoms worsen   If you feel like you are going to pass out (faint)  You have a new problem  MAKE SURE YOU   Understand these instructions.  Will watch your condition.  Will get help right away if you are not doing well or get worse.  Your e-visit answers were reviewed by a board certified advanced clinical practitioner to complete your personal care plan.  Depending on the condition, your plan could have included both over the counter or prescription medications.  If there is a problem please reply  once you have  received a response from your provider.  Your safety is important to [place].  If you have drug allergies check your prescription carefully.    You can use MyChart to ask questions about today's visit, request a non-urgent call back, or ask for a work or school excuse for 24 hours related to this e-Visit. If it has been greater than 24 hours you will need to follow up with your provider, or enter a new e-Visit to address those concerns.   You will get an e-mail in the next two days asking about your experience.  I hope that your e-visit has been valuable and will speed your recovery. Thank you for using e-visits.

## 2018-08-23 MED FILL — ZONISAMIDE 50 MG CAPSULE: 50 | 30 days supply | Qty: 90 | Fill #1

## 2018-10-04 MED FILL — ZONISAMIDE 50 MG CAPSULE: 50 | 30 days supply | Qty: 90 | Fill #2

## 2018-10-04 MED FILL — PROPRANOLOL HCL ER 80 MG CP: 80 | 90 days supply | Qty: 90 | Fill #2

## 2018-10-29 DIAGNOSIS — G43719 Chronic migraine without aura, intractable, without status migrainosus: Secondary | ICD-10-CM | POA: Diagnosis not present

## 2018-10-29 MED FILL — METHOCARBAMOL 500 MG TABS: 500 | 10 days supply | Qty: 20 | Fill #0

## 2018-10-29 MED FILL — ZONISAMIDE 50 MG CAPSULE: 50 | 30 days supply | Qty: 90 | Fill #0

## 2018-10-29 MED FILL — ONDANSETRON HCL 4 MG TABLET: 4 | 4 days supply | Qty: 15 | Fill #0

## 2018-11-04 MED FILL — METFORMIN HCL ER 500 MG TB2: 500 | 30 days supply | Qty: 60 | Fill #2

## 2018-11-12 MED FILL — METFORMIN HCL ER 500 MG TB2: 500 | 90 days supply | Qty: 180 | Fill #2

## 2018-11-12 MED FILL — ONDANSETRON HCL 4 MG TABLET: 4 | 4 days supply | Qty: 15 | Fill #0

## 2018-11-12 MED FILL — ZONISAMIDE 50 MG CAPSULE: 50 | 30 days supply | Qty: 90 | Fill #0

## 2018-12-23 MED FILL — ZONISAMIDE 50 MG CAPSULE: 50 | 30 days supply | Qty: 90 | Fill #1

## 2019-02-02 MED FILL — ZONISAMIDE 50 MG CAPSULE: 50 | 30 days supply | Qty: 90 | Fill #3

## 2019-02-14 MED FILL — PROPRANOLOL HCL ER 80 MG CP: 80 | 90 days supply | Qty: 90 | Fill #3

## 2019-03-04 DIAGNOSIS — G43719 Chronic migraine without aura, intractable, without status migrainosus: Secondary | ICD-10-CM | POA: Diagnosis not present

## 2019-03-04 DIAGNOSIS — M542 Cervicalgia: Secondary | ICD-10-CM | POA: Diagnosis not present

## 2019-03-04 DIAGNOSIS — M791 Myalgia, unspecified site: Secondary | ICD-10-CM | POA: Diagnosis not present

## 2019-03-04 MED FILL — ZONISAMIDE 100 MG CAPSULE: 100 | 3 days supply | Qty: 60 | Fill #0

## 2019-03-04 MED FILL — METHOCARBAMOL 500 MG TABS: 500 | 30 days supply | Qty: 20 | Fill #0

## 2019-03-04 MED FILL — ONDANSETRON HCL 4 MG TABLET: 4 | 15 days supply | Qty: 30 | Fill #0

## 2019-03-08 DIAGNOSIS — B349 Viral infection, unspecified: Secondary | ICD-10-CM | POA: Diagnosis not present

## 2019-03-08 DIAGNOSIS — R05 Cough: Secondary | ICD-10-CM | POA: Diagnosis not present

## 2019-03-08 DIAGNOSIS — R112 Nausea with vomiting, unspecified: Secondary | ICD-10-CM | POA: Diagnosis not present

## 2019-03-08 DIAGNOSIS — Z03818 Encounter for observation for suspected exposure to other biological agents ruled out: Secondary | ICD-10-CM | POA: Diagnosis not present

## 2019-03-08 DIAGNOSIS — R197 Diarrhea, unspecified: Secondary | ICD-10-CM | POA: Diagnosis not present

## 2019-03-08 DIAGNOSIS — R509 Fever, unspecified: Secondary | ICD-10-CM | POA: Diagnosis not present

## 2019-03-08 DIAGNOSIS — R432 Parageusia: Secondary | ICD-10-CM | POA: Diagnosis not present

## 2019-03-09 DIAGNOSIS — B349 Viral infection, unspecified: Secondary | ICD-10-CM | POA: Diagnosis not present

## 2019-03-11 ENCOUNTER — Other Ambulatory Visit: Payer: Self-pay

## 2019-03-11 ENCOUNTER — Ambulatory Visit: Payer: 59 | Admitting: Family Medicine

## 2019-03-16 MED FILL — ONDANSETRON HCL 4 MG TABLET: 4 | 3 days supply | Qty: 20 | Fill #0

## 2019-04-21 MED FILL — ZONISAMIDE 100 MG CAPSULE: 100 | 30 days supply | Qty: 60 | Fill #1

## 2019-04-25 ENCOUNTER — Telehealth: Payer: Self-pay | Admitting: Physician Assistant

## 2019-04-25 NOTE — Telephone Encounter (Signed)
New Message:     Pt cx her appt on [phone]. She says she needs a Friday in February please.

## 2019-04-27 ENCOUNTER — Ambulatory Visit: Payer: 59 | Admitting: Physician Assistant

## 2019-04-27 ENCOUNTER — Other Ambulatory Visit (HOSPITAL_COMMUNITY): Payer: 59

## 2019-05-02 NOTE — Telephone Encounter (Signed)
I spoke with the pt and made her aware of appointments. She requested that virtual visit be rescheduled to a Friday since this is her day off. Visit moved to 2/12.

## 2019-05-02 NOTE — Telephone Encounter (Signed)
Left message on machine for pt to contact the office.   Appointments have been rescheduled.

## 2019-05-25 MED FILL — ZONISAMIDE 100 MG CAPSULE: 100 | 30 days supply | Qty: 60 | Fill #2

## 2019-05-27 ENCOUNTER — Ambulatory Visit (HOSPITAL_COMMUNITY): Payer: No Typology Code available for payment source | Attending: Cardiovascular Disease

## 2019-05-27 ENCOUNTER — Other Ambulatory Visit: Payer: Self-pay

## 2019-05-27 DIAGNOSIS — Z8774 Personal history of (corrected) congenital malformations of heart and circulatory system: Secondary | ICD-10-CM | POA: Diagnosis not present

## 2019-05-30 ENCOUNTER — Telehealth: Payer: 59 | Admitting: Physician Assistant

## 2019-05-31 ENCOUNTER — Telehealth: Payer: Self-pay | Admitting: Internal Medicine

## 2019-05-31 NOTE — Telephone Encounter (Signed)
LVM for patient to call and schedule appt with [facility] or APP for chest pain

## 2019-06-03 ENCOUNTER — Telehealth: Payer: 59 | Admitting: Physician Assistant

## 2019-06-05 NOTE — Progress Notes (Signed)
Cardiology Office Note   Date:  06/06/2019   ID:  Bexlee, Looper [birth year], MRN ID:[id number]  PCP:  Marda Stalker, PA-C  Cardiologist:  Pixie Casino, MD EP: None  Chief Complaint  Patient presents with  . Chest Pain      History of Present Illness: Christina Goodman is a 49 y.o. female with PMH of palpitations on propranolol, cryptogenic stroke 2/2/ PFO s/p PFO closure 03/2018, PCOS on metformin, who presents for the evaluation of chest pain.  She was last evaluated by cardiology at an outpatient visit with Nell Range, PA-C 05/19/2018 for follow-up of her recent PFO closure, at which time she had complaints of exertional chest pain. Decision made to pursue a stress echo if chest pressure continued. She has not been seen since that time. Surveillance bubble study echo 05/27/2019 showed EF 60-65%, G1DD, and PFO device without interatrial shunting. Her last ischemic evaluation was an ETT 01/2018 to evaluate chest pain complaints which was without ischemic EKG changes.  She presents today for the evaluation of chest pain. She reports for the past couple weeks she has had intermittent chest pain. Initially started as left sided chest pain which would last for a couple minutes then resolve spontaneously, occurring mostly at rest. Now she is experiencing central chest tightness which can occur with activity. This lasts for more than a few minutes but less than an hour before resolving spontaneously. No associated symptoms. She trialed pepcid without relief. She feels like she's gained weight recently. Understandably she has been under increased stress this past year, working in [employer] and navigating school challenges with her 9 year old daughter. She reports her palpitations are well controlled on propranolol. She has occasional dizziness which is c/w vertigo. She does have daytime somnolence. No complaints of lightheadedness, syncope, LE edema, orthopnea, PND, or snoring.     Past Medical History:  Diagnosis Date  . Abrasion of right leg 04/07/2017  . Dental crowns present   . History of CVA (cerebrovascular accident) 2011   no deficits  . Migraine   . Migraines   . PFO (patent foramen ovale)   . PONV (postoperative nausea and vomiting)   . Proximal phalanx fracture of finger 03/2017   left small  . Runny nose 04/07/2017   clear drainage, per pt.    Past Surgical History:  Procedure Laterality Date  . CESAREAN SECTION  04/25/2009  . CLOSED REDUCTION FINGER WITH PERCUTANEOUS PINNING Left 04/09/2017   Procedure: LEFT SMALL FINGER CLOSED REDUCTION WITH PERCUTANEOUS PINNING;  Surgeon: Leanora Cover, MD;  Location: [facility];  Service: Orthopedics;  Laterality: Left;  Marland Kitchen MASTOIDECTOMY    . PATENT FORAMEN OVALE(PFO) CLOSURE N/A 04/16/2018   Procedure: PATENT FORAMEN OVALE (PFO) CLOSURE;  Surgeon: Sherren Mocha, MD;  Location: [facility];  Service: Cardiovascular;  Laterality: N/A;  . TEE WITHOUT CARDIOVERSION N/A 08/24/2013   Procedure: TRANSESOPHAGEAL ECHOCARDIOGRAM (TEE);  Surgeon: Larey Dresser, MD;  Location: [facility];  Service: Cardiovascular;  Laterality: N/A;  . TMJ ARTHROPLASTY    . TYMPANOSTOMY TUBE PLACEMENT Bilateral      Current Outpatient Medications  Medication Sig Dispense Refill  . acetaminophen (TYLENOL) 500 MG tablet Take 500 mg by mouth every 6 (six) hours as needed.    Marland Kitchen aspirin 81 MG tablet Take 81 mg by mouth daily. Take 2 low dose asprin daily    . cetirizine (ZYRTEC) 10 MG tablet Take 1 tablet (10 mg total) by  mouth daily. 20 tablet 0  . Coenzyme Q10 (CO Q 10 PO) Take 1 tablet by mouth daily.    Marland Kitchen MAGNESIUM PO Take 500 mg by mouth daily.     . metFORMIN (GLUCOPHAGE) 500 MG tablet Take 500 mg by mouth 2 (two) times daily with a meal.    . ondansetron (ZOFRAN) 4 MG tablet Take 1 tablet (4 mg total) by mouth every 8 (eight) hours as needed for nausea or vomiting. 20 tablet 0  . propranolol ER  (INDERAL LA) 80 MG 24 hr capsule Take 1 capsule (80 mg total) by mouth daily. 90 capsule 3  . ZONISAMIDE PO Take 150 mg by mouth at bedtime.   1  . diphenhydrAMINE (BENADRYL) 50 MG tablet Take 1 tablet (50 mg total) by mouth once for 1 dose. Take 1 hour prior to your coronary CTA 1 tablet 0  . predniSONE (DELTASONE) 50 MG tablet Take 1 tablet 13 hours prior, 1 tablet 7 hours prior, and 1 tablet 1 hours prior to your coronary CTA 3 tablet 0   No current facility-administered medications for this visit.    Allergies:   Ivp dye [iodinated diagnostic agents], Penicillins, Sulfa antibiotics, and Tape    Social History:  The patient  reports that she has never smoked. She has never used smokeless tobacco. She reports that she does not drink alcohol or use drugs.   Family History:  The patient's family history includes CAD in her father; Cancer (age of onset: 46) in her mother; Heart attack in her father, maternal grandfather, maternal grandmother, paternal grandfather, and paternal grandmother.    ROS:  Please see the history of present illness.   Otherwise, review of systems are positive for none.   All other systems are reviewed and negative.    PHYSICAL EXAM: VS:  BP 122/82   Pulse (!) 57   Ht 5\' 9"  (1.753 m)   Wt 197 lb (89.4 kg)   BMI 29.09 kg/m  , BMI Body mass index is 29.09 kg/m. GEN: Well nourished, well developed, in no acute distress HEENT: sclera anicteric  Neck: no JVD, carotid bruits, or masses Cardiac: RRR; no murmurs, rubs, or gallops, no edema, no chest wall TTP Respiratory:  clear to auscultation bilaterally, normal work of breathing GI: soft, nontender, nondistended, + BS MS: no deformity or atrophy Skin: warm and dry, no rash Neuro:  Strength and sensation are intact Psych: euthymic mood, full affect   EKG:  EKG is ordered today. The ekg ordered today demonstrates sinus bradycardia with rate 57 bpm, no STE/D, no TWI; QTc 397   Recent Labs: No results found  for requested labs within last 8760 hours.    Lipid Panel    Component Value Date/Time   CHOL 163 01/27/2018 0902   TRIG 61 01/27/2018 0902   HDL 60 01/27/2018 0902   CHOLHDL 2.7 01/27/2018 0902   LDLCALC 91 01/27/2018 0902      Wt Readings from Last 3 Encounters:  06/06/19 197 lb (89.4 kg)  06/17/18 183 lb 6.4 oz (83.2 kg)  05/19/18 189 lb (85.7 kg)      Other studies Reviewed: Additional studies/ records that were reviewed today include:   Echocardiogram 05/27/19: 1. Left ventricular ejection fraction, by visual estimation, is 60 to  65%. The left ventricle has normal function. There is no increased left  ventricular wall thickness.  2. Left ventricular diastolic parameters are consistent with Grade I  diastolic dysfunction (impaired relaxation).  3. Global right  ventricle has normal systolc function.The right  ventricular size is normal. no increase in right ventricular wall  thickness.  4. 18 mm Amplatzer PFO device. No interatrial shunting.  5. The mitral valve is normal in structure. No evidence of mitral valve  regurgitation. No evidence of mitral stenosis.  6. The tricuspid valve was normal in structure. Tricuspid valve  regurgitation is not demonstrated.  7. Tricuspid valve regurgitation is not demonstrated.  8. No evidence of aortic valve sclerosis or stenosis.  9. The pulmonic valve was normal in structure.  10. The inferior vena cava is normal in size with greater than 50%  respiratory variability, suggesting right atrial pressure of 3 mmHg.    ETT 2019:  Blood pressure demonstrated a hypertensive response to exercise.  There was no ST segment deviation noted during stress.  No EKG criterial of ischemia. Occasional PVCs noted.  Normal exercise capacity achieving 11.4 mets at an adequate heart rate response of 100% MPHR.  This is a low risk study       ASSESSMENT AND PLAN:  1. Chest pain: vague symptoms with some exertional component. She  does not have significant risk factors for CAD aside from family history of premature CAD in her father who had an MI in his 61's, though he was a smoker. Recent echo reassuring with EF 60-65% and no RWMA. EKG today is non-ischemic.  - Will plan for a Coronary CTA to further evaluate chest pain. She will need premedication for contrast allergy which is ordered. She was instructed to hold metformin before and after her CT scan - Will check BMET prior to CTA, as well as FLP and HgbA1C for risk stratification - Could consider sleep study if above work-up is negative.  2. Palpitations: well controlled on propranolol - Continue propranolol  3. PFO s/p closure 03/2018: surveillance echo 05/27/19 was without interatrial shunting.  - Continue aspirin   Current medicines are reviewed at length with the patient today.  The patient does not have concerns regarding medicines.  The following changes have been made:  As above  Labs/ tests ordered today include:   Orders Placed This Encounter  Procedures  . CT CORONARY MORPH W/CTA COR W/SCORE W/CA W/CM &/OR WO/CM  . CT CORONARY FRACTIONAL FLOW RESERVE DATA PREP  . CT CORONARY FRACTIONAL FLOW RESERVE FLUID ANALYSIS  . Basic metabolic panel  . Lipid panel  . HgB A1c  . EKG 12-Lead     Disposition:   FU with me 1 week after her coronary CTA   Signed, Abigail Butts, PA-C  06/06/2019 11:38 AM

## 2019-06-06 ENCOUNTER — Other Ambulatory Visit: Payer: Self-pay

## 2019-06-06 ENCOUNTER — Ambulatory Visit (INDEPENDENT_AMBULATORY_CARE_PROVIDER_SITE_OTHER): Payer: No Typology Code available for payment source | Admitting: Medical

## 2019-06-06 ENCOUNTER — Encounter: Payer: Self-pay | Admitting: Medical

## 2019-06-06 VITALS — BP 122/82 | HR 57 | Ht 69.0 in | Wt 197.0 lb

## 2019-06-06 DIAGNOSIS — Q211 Atrial septal defect: Secondary | ICD-10-CM | POA: Diagnosis not present

## 2019-06-06 DIAGNOSIS — Q2112 Patent foramen ovale: Secondary | ICD-10-CM

## 2019-06-06 DIAGNOSIS — R072 Precordial pain: Secondary | ICD-10-CM

## 2019-06-06 DIAGNOSIS — R002 Palpitations: Secondary | ICD-10-CM

## 2019-06-06 DIAGNOSIS — Z8774 Personal history of (corrected) congenital malformations of heart and circulatory system: Secondary | ICD-10-CM

## 2019-06-06 MED ORDER — PREDNISONE 50 MG PO TABS: ORAL_TABLET | ORAL | 0 refills | Status: DC

## 2019-06-06 MED ORDER — DIPHENHYDRAMINE HCL 50 MG PO TABS: 50.0000 mg | ORAL_TABLET | Freq: Once | ORAL | 0 refills | Status: DC

## 2019-06-06 MED FILL — predniSONE 50 MG TABS: 50 | 1 days supply | Qty: 3 | Fill #0

## 2019-06-06 NOTE — Patient Instructions (Addendum)
Medication Instructions:   Your physician recommends that you continue on your current medications as directed. Please refer to the Current Medication list given to you today.  *If you need a refill on your cardiac medications before your next appointment, please call your pharmacy*  Lab Work: You will need to have labs (blood work) drawn 1 week prior to the CT:  BMET  Fasting Lipid Panel-DO NOT EAT OR DRINK PAST MIDNIGHT. (OK TO Bryce)   HEMOGLOBIN A1c If you have labs (blood work) drawn today and your tests are completely normal, you will receive your results only by: Marland Kitchen MyChart Message (if you have MyChart) OR . A paper copy in the mail If you have any lab test that is abnormal or we need to change your treatment, we will call you to review the results.  Testing/Procedures: Your physician has requested that you have cardiac CT. Cardiac computed tomography (CT) is a painless test that uses an x-ray machine to take clear, detailed pictures of your heart. For further information please visit [email or web address]. Please follow instruction sheet as given.   Follow-Up: At [facility], you and your health needs are our priority.  As part of our continuing mission to provide you with exceptional heart care, we have created designated Provider Care Teams.  These Care Teams include your primary Cardiologist (physician) and Advanced Practice Providers (APPs -  Physician Assistants and Nurse Practitioners) who all work together to provide you with the care you need, when you need it.  Your next appointment:   1 week(s)  The format for your next appointment:   In Person  Provider:   Roby Lofts, PA-C  Other Instructions   Your cardiac CT will be scheduled at one of the below locations:   [facility] [address] [place], [place] [address] [phone]  [place] [address] [place], [place]  [address] [phone]  If scheduled at [facility], please arrive at the [facility] main entrance of [facility] 30 minutes prior to test start time. Proceed to the [facility] Radiology Department (first floor) to check-in and test prep.  If scheduled at [facility], please arrive 15 mins early for check-in and test prep.  Please follow these instructions carefully (unless otherwise directed):   On the Night Before the Test: . Be sure to Drink plenty of water. . Do not consume any caffeinated/decaffeinated beverages or chocolate 12 hours prior to your test. . Do not take any antihistamines 12 hours prior to your test. . If you take Metformin do not take 24 hours prior to test. . If the patient has contrast allergy: ? Patient will need a prescription for Prednisone and very clear instructions (as follows): 1. Prednisone 50 mg - take 13 hours prior to test 2. Take another Prednisone 50 mg 7 hours prior to test 3. Take another Prednisone 50 mg 1 hour prior to test 4. Take Benadryl 50 mg 1 hour prior to test . Patient must complete all four doses of above prophylactic medications. . Patient will need a ride after test due to Benadryl.  On the Day of the Test: . Drink plenty of water. Do not drink any water within one hour of the test. . Do not eat any food 4 hours prior to the test. . You may take your regular medications prior to the test.  . Take metoprolol (Lopressor) two hours prior to test. .  HOLD Furosemide/Hydrochlorothiazide morning of the test. . FEMALES- please wear underwire-free bra if available   *For Clinical Staff only. Please instruct patient the following:*        -Drink plenty of water       -Hold Furosemide/hydrochlorothiazide morning of the test       -Take metoprolol (Lopressor) 2 hours prior to test (if applicable).                  -If HR is less than 55 BPM- No Beta Blocker                -IF HR is greater than 55  BPM and patient is less than or equal to 9 yrs old Lopressor 100mg  x1.                -If HR is greater than 55 BPM and patient is greater than 60 yrs old Lopressor 50 mg x1.     Do not give Lopressor to patients with an allergy to lopressor or anyone with asthma or active COPD symptoms (currently taking steroids).       After the Test: . Drink plenty of water. . After receiving IV contrast, you may experience a mild flushed feeling. This is normal. . On occasion, you may experience a mild rash up to 24 hours after the test. This is not dangerous. If this occurs, you can take Benadryl 25 mg and increase your fluid intake. . If you experience trouble breathing, this can be serious. If it is severe call 911 IMMEDIATELY. If it is mild, please call our office. . If you take any of these medications: Glipizide/Metformin, Avandament, Glucavance, please do not take 48 hours after completing test unless otherwise instructed.   Once we have confirmed authorization from your insurance company, we will call you to set up a date and time for your test.   For non-scheduling related questions, please contact the cardiac imaging nurse navigator should you have any questions/concerns: Marchia Bond, RN Navigator Cardiac Imaging [facility] [phone] mobile

## 2019-06-24 ENCOUNTER — Ambulatory Visit: Payer: 59 | Admitting: Family Medicine

## 2019-07-01 ENCOUNTER — Other Ambulatory Visit: Payer: Self-pay | Admitting: Internal Medicine

## 2019-07-01 MED FILL — PROPRANOLOL HCL ER 80 MG CP: 80 | 90 days supply | Qty: 90 | Fill #0

## 2019-07-08 MED FILL — UBRELVY 50 MG TABS: 50 | 30 days supply | Qty: 10 | Fill #0

## 2019-07-08 MED FILL — ZONISAMIDE 100 MG CAPSULE: 100 | 30 days supply | Qty: 60 | Fill #0

## 2019-07-08 MED FILL — PROMETHAZINE 25 MG SUPP: 25 | 30 days supply | Qty: 20 | Fill #0

## 2019-07-08 MED FILL — tiZANidine HCL 4 MG TABS: 4 | 30 days supply | Qty: 20 | Fill #0

## 2019-07-26 ENCOUNTER — Encounter (HOSPITAL_COMMUNITY): Payer: Self-pay

## 2019-07-27 ENCOUNTER — Other Ambulatory Visit: Payer: Self-pay

## 2019-07-27 DIAGNOSIS — R072 Precordial pain: Secondary | ICD-10-CM

## 2019-07-28 ENCOUNTER — Encounter (HOSPITAL_COMMUNITY): Payer: Self-pay

## 2019-07-28 ENCOUNTER — Telehealth (HOSPITAL_COMMUNITY): Payer: Self-pay | Admitting: Emergency Medicine

## 2019-07-28 LAB — BASIC METABOLIC PANEL
BUN/Creatinine Ratio: 17 (ref 9–23)
BUN: 17 mg/dL (ref 6–24)
CO2: 22 mmol/L (ref 20–29)
Calcium: 9.7 mg/dL (ref 8.7–10.2)
Chloride: 106 mmol/L (ref 96–106)
Creatinine, Ser: 0.98 mg/dL (ref 0.57–1.00)
GFR calc Af Amer: 80 mL/min/{1.73_m2} (ref >59)
GFR calc non Af Amer: 69 mL/min/{1.73_m2} (ref >59)
Glucose: 85 mg/dL (ref 65–99)
Potassium: 4.5 mmol/L (ref 3.5–5.2)
Sodium: 143 mmol/L (ref 134–144)

## 2019-07-28 LAB — HEMOGLOBIN A1C
Est. average glucose Bld gHb Est-mCnc: 114 mg/dL
Hgb A1c MFr Bld: 5.6 % (ref 4.8–5.6)

## 2019-07-28 LAB — LIPID PANEL
Chol/HDL Ratio: 3.5 ratio (ref 0.0–4.4)
Cholesterol, Total: 176 mg/dL (ref 100–199)
HDL: 50 mg/dL (ref >39)
LDL Chol Calc (NIH): 105 mg/dL — ABNORMAL HIGH (ref 0–99)
Triglycerides: 118 mg/dL (ref 0–149)
VLDL Cholesterol Cal: 21 mg/dL (ref 5–40)

## 2019-07-28 NOTE — Telephone Encounter (Signed)
Attempted to call patient regarding upcoming cardiac CT appointment. °Left message on voicemail with name and callback number °Nesbit Michon RN Navigator Cardiac Imaging °[facility] °[phone] Office °[phone] Cell ° °

## 2019-07-29 ENCOUNTER — Other Ambulatory Visit: Payer: Self-pay

## 2019-07-29 ENCOUNTER — Ambulatory Visit (HOSPITAL_COMMUNITY)
Admission: RE | Admit: 2019-07-29 | Discharge: 2019-07-29 | Disposition: A | Discharge status: Home / Self Care | Payer: No Typology Code available for payment source | Source: Ambulatory Visit | Attending: Medical | Admitting: Medical

## 2019-07-29 DIAGNOSIS — R072 Precordial pain: Secondary | ICD-10-CM | POA: Insufficient documentation

## 2019-07-29 MED ORDER — NITROGLYCERIN 0.4 MG SL SUBL
0.8000 mg | SUBLINGUAL_TABLET | Freq: Once | SUBLINGUAL | Status: AC
  Administered 2019-07-29: 0.8 mg via SUBLINGUAL

## 2019-07-29 MED ORDER — NITROGLYCERIN 0.4 MG SL SUBL
SUBLINGUAL_TABLET | SUBLINGUAL | Status: AC
  Filled 2019-07-29: qty 2

## 2019-07-29 MED ORDER — METOPROLOL TARTRATE 5 MG/5ML IV SOLN
5.0000 mg | INTRAVENOUS | Status: DC | PRN
  Administered 2019-07-29: 5 mg via INTRAVENOUS

## 2019-07-29 MED ORDER — METOPROLOL TARTRATE 5 MG/5ML IV SOLN
INTRAVENOUS | Status: AC
  Administered 2019-07-29: 5 mg via INTRAVENOUS
  Filled 2019-07-29: qty 10

## 2019-07-29 MED ORDER — IOHEXOL 350 MG/ML SOLN
80.0000 mL | Freq: Once | INTRAVENOUS | Status: AC | PRN
  Administered 2019-07-29: 80 mL via INTRAVENOUS

## 2019-08-10 MED FILL — ZONISAMIDE 100 MG CAPSULE: 100 | 30 days supply | Qty: 60 | Fill #1

## 2019-08-18 MED FILL — UBRELVY 50 MG TABS: 50 | 30 days supply | Qty: 10 | Fill #1

## 2019-09-09 MED FILL — ZONISAMIDE 100 MG CAPSULE: 100 | 30 days supply | Qty: 60 | Fill #2

## 2019-10-14 MED FILL — ZONISAMIDE 100 MG CAPSULE: 100 | 30 days supply | Qty: 60 | Fill #3

## 2019-10-21 ENCOUNTER — Telehealth: Payer: Self-pay | Admitting: Internal Medicine

## 2019-10-21 MED FILL — ONDANSETRON HCL 4 MG TABS: 4 | 15 days supply | Qty: 30 | Fill #0

## 2019-10-21 MED FILL — PROPRANOLOL HCL ER 60 MG CP: 60 | 14 days supply | Qty: 14 | Fill #0

## 2019-10-21 NOTE — Telephone Encounter (Signed)
April 2022 is fine, unless she needs [place] sooner.  Dr Lemmie Evens

## 2019-10-21 NOTE — Telephone Encounter (Signed)
OK .. I'm happy to see her sooner.

## 2019-10-21 NOTE — Telephone Encounter (Signed)
Christina Goodman is calling confused as to when her next appt is due. I advised her the recall states April of 2022, but she states she had thought Dr. Debara Pickett wanted to see her sooner. Please advise.

## 2019-10-21 NOTE — Telephone Encounter (Signed)
Patient advised of MD advice on when to follow up She reports nausea & dizziness and saw PCP for this today - HR was in the 40s.  PCP decreased propranolol to 60mg  but advised her to follow up with cardiologist Suggested that she try decreased dose and track BP/HR and call early next week with update on vitals and symptoms If her HR is still low and she is unwell, can schedule OV

## 2019-10-21 NOTE — Telephone Encounter (Signed)
Patient last saw Dr. Debara Pickett in 2019, saw Daleen Snook in Feb and had coronary CT in April.   Please advise when next appointment is due - recall is for April 2022

## 2019-10-26 NOTE — Telephone Encounter (Signed)
Left message to follow up with patient. MyChart message sent as well

## 2019-10-28 MED FILL — TINIDAZOLE 500 MG TAB: 500 | 5 days supply | Qty: 10 | Fill #0

## 2019-11-08 MED FILL — SLYND 4 MG TABS: 4 | 84 days supply | Qty: 84 | Fill #0

## 2019-11-11 MED FILL — CITALOPRAM HBR 10 MG TABLET: 10 | 30 days supply | Qty: 30 | Fill #0

## 2019-11-11 MED FILL — AZITHROMYCIN 250 MG TABS: 250 | 5 days supply | Qty: 6 | Fill #0

## 2019-11-17 MED FILL — ZONISAMIDE 100 MG CAPSULE: 100 | 30 days supply | Qty: 60 | Fill #4
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
## 2019-12-13 MED FILL — ZONISAMIDE 100 MG CAPSULE: 100 | 30 days supply | Qty: 60 | Fill #0

## 2019-12-13 MED FILL — tiZANidine HCL 4 MG TABS: 4 | 90 days supply | Qty: 60 | Fill #0

## 2019-12-13 MED FILL — predniSONE 20 MG TABS: 20 | 7 days supply | Qty: 7 | Fill #0

## 2019-12-13 MED FILL — VENLAFAXINE HCL ER 37.5 MG: 37.5 | 90 days supply | Qty: 90 | Fill #0

## 2019-12-13 MED FILL — ONDANSETRON HCL 4 MG TABS: 4 | 15 days supply | Qty: 30 | Fill #0

## 2019-12-16 MED FILL — UBRELVY 50 MG TABS: 50 | 30 days supply | Qty: 10 | Fill #0

## 2020-01-10 NOTE — Progress Notes (Deleted)
[facility]    Provider:  Dr Jaynee Eagles Requesting Provider: Marda Stalker, PA-C Primary Care Provider:  Marda Stalker, PA-C  CC:  ***  HPI:  Christina Goodman is a 49 y.o. female here as requested by Marda Stalker, PA-C for migraines.  She has a past medical history of PCOS, small vessel infarction and subsequent PFO found on work-up, migraine without aura and without status migrainosus not intractable.  Patient has a history of anxiety, she did not tolerate Zoloft, she also was seen for atypical chest pain, echo was reassuring and EKG was nonischemic, seeing cardiology, she was recommended to consider a sleep evaluation due to fatigue, she has longstanding migraines and was following with neurology Dr. Ruthann Cancer she was diagnosed about 10 years ago, family history of migraines in mother and sister, has had Botox, steroid, usually has 1 to 2/month, sometimes not another month she may have 3-4, she cannot take it due to prior CVA due to PFO and closure in 2019, after an incident of garbled speech and trouble with her balance, however she has noted more frequent headaches for a few weeks (appointment November 11, 2019).  She has allergies to triptans including naratriptan stating?  CVA in her referral so this may be more of a contraindication we will discuss with patient at appointment.  She is on zonisamide, citalopram, Zofran, propranolol, tizanidine, aspirin, she is also had dizziness possibly in the setting of propranolol but she contacted cardiology who felt that 60 mg was fine as long as patient was symptomatically improved, she has a skin picking habit and a lower dose of citalopram was discussed, also subacute sinusitis on a nasal spray.  I was able to find an neurology note 2015 from [facility], patient was complaining of dizziness, MRI of the brain was performed in light of previous history of stroke and brisk reflexes on exam, note states she had a previous stroke and  when she was seen in June 2015 note states she had migraines for a long time she had an MRI of the brain during migraine work-up and was told she had a stroke, unfortunately neurologist states he was not able to to locate any neuro imaging, she reported dizziness with her migraines and not walking well, migraines for 5 years, starting on the neck and the left, feels stabbing in her left head and is associated with vomiting, photo and osmophobia, at that time she was having about 18 migraines a month worse with stress, flashing lights during part of the migraine usually in the beginning.  She was getting Botox for headaches without improvement, she tried Topamax but reported kidney stones, different blood pressure medications, Relpax and Maxalt which was stopped after the stroke.  Appears as though she had a thorough evaluation, 30-day cardiac monitoring, imaging, but I do not have all those records, she did have significant blood evaluation including sed rate 22, CRP 8.2, ANA negative, lupus inhibitor not detected, homocystine normal for this note I am reviewing from 2015.  There is also states she had complete carotid transcranial completed, patent foramen ovale, "unremarkable carotid ultrasounds ", MRI impression: "Hypoplastic right vertebral artery, no evidence of dissection or significant stenosis, tortuous bilateral cervical internal carotid arteries" by Julian Reil, MD July 04, 2013.  Also protein C normal, protein S normal, cardiolipin antibodies negative, factor V Leiden negative, prothrombin gene mutation negative, TEE showed a moderate sized PFO with bubbles crossing right to left at rest, MRI of the neck and MRI of  the head "mild to moderate tortuosity of the cervical ICA bilaterally worse on the left, there is associated stenosis, this is nonspecific but commonly seen in the setting of hypertension, and left dominant vertebral artery without focal stenosis, normal variant MRI circle of Willis" MRI  without contrast June 27, 2013: "No specific cause of headache, normal appearance of the brain with the exception of a small vessel infarct at the inferior cerebellum on the left, previous left mastoidectomy, small amount of fluid in some of the residual mastoid air cells"  Medications patient has tried that can be used in migraine management include: Topamax reported kidney stones, "different blood pressure medications", Daypro, Relpax and Maxalt stopped due to contraindication after stroke, propranolol, SSRIs, Botox, Percocet and Flexeril.  Candesartan was mentioned unclear if she had in the past, zonisamide, tylenol, flexeril, gabapentin, hydrocodone, ketorolac injections, magnesium p.o., Zofran feet p.o. and injections, oxycodone, prednisone tablets, Compazine injections, propranolol, tizanidine  Reviewed notes, labs and imaging from outside physicians, which showed ***  Labs collected October 21, 2019 showed BUN 19, creatinine 0.97, otherwise normal, CBC in June 2021 normal.  TSH in July 2 of 2021 1.19 normal.  Review of Systems: Patient complains of symptoms per HPI as well as the following symptoms ***. Pertinent negatives and positives per HPI. All others negative.   Social History   Socioeconomic History  . Marital status: Single    Spouse name: Not on file  . Number of children: 0  . Years of education: Not on file  . Highest education level: Not on file  Occupational History  . Occupation: ultrasound Engineer, production: [employer]  Tobacco Use  . Smoking status: Never Smoker  . Smokeless tobacco: Never Used  Vaping Use  . Vaping Use: Never used  Substance and Sexual Activity  . Alcohol use: No    Comment: occasionally  . Drug use: No  . Sexual activity: Yes  Other Topics Concern  . Not on file  Social History Narrative  . Not on file   Social Determinants of Health   Financial Resource Strain:   . Difficulty of Paying Living Expenses: Not on file  Food Insecurity:   .  Worried About Charity fundraiser in the Last Year: Not on file  . Ran Out of Food in the Last Year: Not on file  Transportation Needs:   . Lack of Transportation (Medical): Not on file  . Lack of Transportation (Non-Medical): Not on file  Physical Activity:   . Days of Exercise per Week: Not on file  . Minutes of Exercise per Session: Not on file  Stress:   . Feeling of Stress : Not on file  Social Connections:   . Frequency of Communication with Friends and Family: Not on file  . Frequency of Social Gatherings with Friends and Family: Not on file  . Attends Religious Services: Not on file  . Active Member of Clubs or Organizations: Not on file  . Attends Archivist Meetings: Not on file  . Marital Status: Not on file  Intimate Partner Violence:   . Fear of Current or Ex-Partner: Not on file  . Emotionally Abused: Not on file  . Physically Abused: Not on file  . Sexually Abused: Not on file    Family History  Problem Relation Age of Onset  . Cancer Mother 64  . CAD Father   . Heart attack Father   . Heart attack Paternal Grandfather   . Heart  attack Paternal Grandmother   . Heart attack Maternal Grandfather   . Heart attack Maternal Grandmother     Past Medical History:  Diagnosis Date  . Abrasion of right leg 04/07/2017  . Dental crowns present   . History of CVA (cerebrovascular accident) 2011   no deficits  . Migraine   . Migraines   . PFO (patent foramen ovale)   . PONV (postoperative nausea and vomiting)   . Proximal phalanx fracture of finger 03/2017   left small  . Runny nose 04/07/2017   clear drainage, per pt.    Patient Active Problem List   Diagnosis Date Noted  . PFO with atrial septal aneurysm 04/16/2018  . Palpitations 01/27/2018  . Migraine headache 08/02/2013  . CVA (cerebral vascular accident) ([place]) 08/02/2013    Past Surgical History:  Procedure Laterality Date  . CESAREAN SECTION  04/25/2009  . CLOSED REDUCTION FINGER WITH  PERCUTANEOUS PINNING Left 04/09/2017   Procedure: LEFT SMALL FINGER CLOSED REDUCTION WITH PERCUTANEOUS PINNING;  Surgeon: Leanora Cover, MD;  Location: [facility];  Service: Orthopedics;  Laterality: Left;  Marland Kitchen MASTOIDECTOMY    . PATENT FORAMEN OVALE(PFO) CLOSURE N/A 04/16/2018   Procedure: PATENT FORAMEN OVALE (PFO) CLOSURE;  Surgeon: Sherren Mocha, MD;  Location: [facility];  Service: Cardiovascular;  Laterality: N/A;  . TEE WITHOUT CARDIOVERSION N/A 08/24/2013   Procedure: TRANSESOPHAGEAL ECHOCARDIOGRAM (TEE);  Surgeon: Larey Dresser, MD;  Location: [facility];  Service: Cardiovascular;  Laterality: N/A;  . TMJ ARTHROPLASTY    . TYMPANOSTOMY TUBE PLACEMENT Bilateral     Current Outpatient Medications  Medication Sig Dispense Refill  . acetaminophen (TYLENOL) 500 MG tablet Take 500 mg by mouth every 6 (six) hours as needed.    Marland Kitchen aspirin 81 MG tablet Take 81 mg by mouth daily. Take 2 low dose asprin daily    . cetirizine (ZYRTEC) 10 MG tablet Take 1 tablet (10 mg total) by mouth daily. 20 tablet 0  . Coenzyme Q10 (CO Q 10 PO) Take 1 tablet by mouth daily.    . diphenhydrAMINE (BENADRYL) 50 MG tablet Take 1 tablet (50 mg total) by mouth once for 1 dose. Take 1 hour prior to your coronary CTA 1 tablet 0  . MAGNESIUM PO Take 500 mg by mouth daily.     . metFORMIN (GLUCOPHAGE) 500 MG tablet Take 500 mg by mouth 2 (two) times daily with a meal.    . ondansetron (ZOFRAN) 4 MG tablet Take 1 tablet (4 mg total) by mouth every 8 (eight) hours as needed for nausea or vomiting. 20 tablet 0  . predniSONE (DELTASONE) 50 MG tablet Take 1 tablet 13 hours prior, 1 tablet 7 hours prior, and 1 tablet 1 hours prior to your coronary CTA 3 tablet 0  . propranolol ER (INDERAL LA) 80 MG 24 hr capsule TAKE 1 CAPSULE (80 MG TOTAL) BY MOUTH DAILY. 90 capsule 3  . ZONISAMIDE PO Take 150 mg by mouth at bedtime.   1   No current facility-administered medications for this visit.    Allergies  as of 01/11/2020 - Review Complete 06/06/2019  Allergen Reaction Noted  . Ivp dye [iodinated diagnostic agents] Hives 05/08/2011  . Penicillins Hives 04/07/2017  . Sulfa antibiotics Hives 05/08/2011  . Tape Other (See Comments) 08/24/2013    Vitals: There were no vitals taken for this visit. Last Weight:  Wt Readings from Last 1 Encounters:  06/06/19 197 lb (89.4 kg)   Last Height:  Ht Readings from Last 1 Encounters:  06/06/19 5\' 9"  (1.753 m)     Physical exam: Exam: Gen: NAD, conversant, well nourised, obese, well groomed                     CV: RRR, no MRG. No Carotid Bruits. No peripheral edema, warm, nontender Eyes: Conjunctivae clear without exudates or hemorrhage  Neuro: Detailed Neurologic Exam  Speech:    Speech is normal; fluent and spontaneous with normal comprehension.  Cognition:    The patient is oriented to person, place, and time;     recent and remote memory intact;     language fluent;     normal attention, concentration,     fund of knowledge Cranial Nerves:    The pupils are equal, round, and reactive to light. The fundi are normal and spontaneous venous pulsations are present. Visual fields are full to finger confrontation. Extraocular movements are intact. Trigeminal sensation is intact and the muscles of mastication are normal. The face is symmetric. The palate elevates in the midline. Hearing intact. Voice is normal. Shoulder shrug is normal. The tongue has normal motion without fasciculations.   Coordination:    Normal finger to nose and heel to shin. Normal rapid alternating movements.   Gait:    Heel-toe and tandem gait are normal.   Motor Observation:    No asymmetry, no atrophy, and no involuntary movements noted. Tone:    Normal muscle tone.    Posture:    Posture is normal. normal erect    Strength:    Strength is V/V in the upper and lower limbs.      Sensation: intact to LT     Reflex Exam:  DTR's:    Deep tendon reflexes  in the upper and lower extremities are normal bilaterally.   Toes:    The toes are downgoing bilaterally.   Clonus:    Clonus is absent.    Assessment/Plan:    No orders of the defined types were placed in this encounter.  No orders of the defined types were placed in this encounter.   Cc: Marda Stalker, PA-C  Sarina Ill, MD  [facility] [address] [place], [place] [address]  Phone [phone] Fax [phone]

## 2020-01-11 ENCOUNTER — Ambulatory Visit: Payer: No Typology Code available for payment source | Admitting: Neurology

## 2020-03-21 ENCOUNTER — Ambulatory Visit: Payer: No Typology Code available for payment source | Admitting: Physician Assistant

## 2020-05-16 ENCOUNTER — Other Ambulatory Visit (HOSPITAL_COMMUNITY): Payer: Self-pay | Admitting: Nurse Practitioner

## 2020-05-16 MED FILL — TIZANIDINE HCL 4 MG CAPS: 4 | 10 days supply | Qty: 30 | Fill #0

## 2020-07-12 ENCOUNTER — Other Ambulatory Visit (HOSPITAL_BASED_OUTPATIENT_CLINIC_OR_DEPARTMENT_OTHER): Payer: Self-pay

## 2020-10-02 ENCOUNTER — Other Ambulatory Visit (HOSPITAL_COMMUNITY): Payer: Self-pay

## 2020-10-02 MED ORDER — EMGALITY 120 MG/ML ~~LOC~~ SOSY
PREFILLED_SYRINGE | SUBCUTANEOUS | 0 refills | Status: DC
  Filled 2020-10-02 (×2): qty 2, 30d supply, fill #0

## 2020-10-02 MED ORDER — EMGALITY 120 MG/ML ~~LOC~~ SOSY: PREFILLED_SYRINGE | SUBCUTANEOUS | 3 refills | Status: DC

## 2020-10-03 ENCOUNTER — Other Ambulatory Visit (HOSPITAL_COMMUNITY): Payer: Self-pay

## 2020-10-05 ENCOUNTER — Other Ambulatory Visit (HOSPITAL_COMMUNITY): Payer: Self-pay

## 2020-10-09 ENCOUNTER — Other Ambulatory Visit (HOSPITAL_COMMUNITY): Payer: Self-pay

## 2020-10-10 ENCOUNTER — Other Ambulatory Visit (HOSPITAL_COMMUNITY): Payer: Self-pay

## 2020-10-15 ENCOUNTER — Other Ambulatory Visit (HOSPITAL_COMMUNITY): Payer: Self-pay

## 2020-10-15 MED ORDER — EMGALITY 120 MG/ML ~~LOC~~ SOSY
PREFILLED_SYRINGE | SUBCUTANEOUS | 3 refills | Status: DC
  Filled 2020-10-15: qty 1, 30d supply, fill #0

## 2020-10-24 ENCOUNTER — Other Ambulatory Visit (HOSPITAL_COMMUNITY): Payer: Self-pay

## 2020-10-30 ENCOUNTER — Other Ambulatory Visit: Payer: Self-pay | Admitting: Sports Medicine

## 2020-10-30 DIAGNOSIS — M25512 Pain in left shoulder: Secondary | ICD-10-CM

## 2020-11-12 ENCOUNTER — Ambulatory Visit
Admission: RE | Admit: 2020-11-12 | Discharge: 2020-11-12 | Disposition: A | Discharge status: Home / Self Care | Payer: Medicaid Other | Source: Ambulatory Visit | Attending: Sports Medicine | Admitting: Sports Medicine

## 2020-11-12 ENCOUNTER — Other Ambulatory Visit: Payer: Self-pay

## 2020-11-12 DIAGNOSIS — M25512 Pain in left shoulder: Secondary | ICD-10-CM

## 2020-11-29 ENCOUNTER — Other Ambulatory Visit (HOSPITAL_COMMUNITY): Payer: Self-pay

## 2020-11-29 MED ORDER — TIZANIDINE HCL 4 MG PO TABS
4.0000 mg | ORAL_TABLET | Freq: Three times a day (TID) | ORAL | 3 refills | Status: AC | PRN
  Filled 2020-11-29: qty 30, 10d supply, fill #0
  Filled 2021-03-06: qty 30, 10d supply, fill #1

## 2020-11-29 MED ORDER — ONDANSETRON HCL 4 MG PO TABS
ORAL_TABLET | ORAL | 3 refills | Status: DC
  Filled 2020-11-29: qty 20, 10d supply, fill #0

## 2020-12-10 ENCOUNTER — Other Ambulatory Visit (HOSPITAL_COMMUNITY): Payer: Self-pay

## 2020-12-10 MED ORDER — UBRELVY 50 MG PO TABS
ORAL_TABLET | ORAL | 3 refills | Status: DC
  Filled 2020-12-10: qty 10, 30d supply, fill #0

## 2020-12-11 ENCOUNTER — Other Ambulatory Visit (HOSPITAL_COMMUNITY): Payer: Self-pay

## 2020-12-14 ENCOUNTER — Other Ambulatory Visit (HOSPITAL_COMMUNITY): Payer: Self-pay

## 2020-12-20 ENCOUNTER — Other Ambulatory Visit (HOSPITAL_COMMUNITY): Payer: Self-pay

## 2020-12-21 ENCOUNTER — Other Ambulatory Visit (HOSPITAL_COMMUNITY): Payer: Self-pay

## 2020-12-25 ENCOUNTER — Other Ambulatory Visit (HOSPITAL_COMMUNITY): Payer: Self-pay

## 2020-12-27 ENCOUNTER — Other Ambulatory Visit (HOSPITAL_COMMUNITY): Payer: Self-pay

## 2020-12-28 ENCOUNTER — Other Ambulatory Visit (HOSPITAL_COMMUNITY): Payer: Self-pay

## 2020-12-31 ENCOUNTER — Other Ambulatory Visit (HOSPITAL_COMMUNITY): Payer: Self-pay

## 2021-01-02 ENCOUNTER — Other Ambulatory Visit (HOSPITAL_COMMUNITY): Payer: Self-pay

## 2021-01-03 ENCOUNTER — Other Ambulatory Visit (HOSPITAL_COMMUNITY): Payer: Self-pay

## 2021-01-04 ENCOUNTER — Other Ambulatory Visit (HOSPITAL_COMMUNITY): Payer: Self-pay

## 2021-01-07 ENCOUNTER — Other Ambulatory Visit (HOSPITAL_COMMUNITY): Payer: Self-pay

## 2021-01-08 ENCOUNTER — Other Ambulatory Visit (HOSPITAL_COMMUNITY): Payer: Self-pay

## 2021-01-28 ENCOUNTER — Other Ambulatory Visit: Payer: Self-pay | Admitting: Nurse Practitioner

## 2021-01-28 DIAGNOSIS — M5412 Radiculopathy, cervical region: Secondary | ICD-10-CM

## 2021-02-09 ENCOUNTER — Other Ambulatory Visit: Payer: Self-pay

## 2021-02-09 ENCOUNTER — Ambulatory Visit
Admission: RE | Admit: 2021-02-09 | Discharge: 2021-02-09 | Disposition: A | Discharge status: Home / Self Care | Payer: Medicaid Other | Source: Ambulatory Visit | Attending: Nurse Practitioner | Admitting: Nurse Practitioner

## 2021-02-09 DIAGNOSIS — M5412 Radiculopathy, cervical region: Secondary | ICD-10-CM

## 2021-03-06 ENCOUNTER — Other Ambulatory Visit (HOSPITAL_COMMUNITY): Payer: Self-pay

## 2021-03-15 ENCOUNTER — Other Ambulatory Visit (HOSPITAL_COMMUNITY): Payer: Self-pay

## 2021-03-26 NOTE — Progress Notes (Signed)
Cardiology Office Note:    Date:  03/27/2021   ID:  Christina Goodman, Christina Goodman 11/29/1970, MRN [id number]  PCP:  Marda Stalker, [place] [facility] Cardiologist:  Pixie Casino, MD     Referring MD: Marda Stalker, PA-C   Chief Complaint: here for follow-up and refill of propranolol  History of Present Illness:    Christina Goodman is a 50 y.o. female with a hx of CVA, PFO with atrial septal aneurysm, s/p PFO closure 12/19, migraine, and palpitations.   She established care with our group in 2015 for evaluation following CVA and was subsequently found to have a moderate-sized PFO with right to left shunt. After discussion with Dr. Burt Knack, Structural Heart Interventional Cardiologist, she did not undergo PFO closure at that time. She returned to our group in 2019 for evaluation of palpitations and she subsequently underwent PFO closure Dec. 2019. Her last office visit was 06/06/19 with Roby Lofts, PA and at that time, she was evaluated for chest pain. She had a coronary CT in April 2021 that revealed a coronary calcium score of 0. She has not followed-up with our office since that time.   Today she is here alone. She reports two occasions when a squeezing pain in her left chest stopped her in her tracks. These have occurred since she has had a frozen left shoulder that occurred following Covid-19 vaccine April 2022. She thinks that the symptoms are related to her the shoulder pain. States she called to get a refill for her propranolol and was told she had to come in for an appointment. She denies chest pain with the exception noted above, shortness of breath, lower extremity edema, palpitations, diaphoresis, weakness, presyncope, syncope, orthopnea, and PND. Reports BP readings are generally 120/70 at home. States she is frustrated by her lack of exercise and weight gain in recent months. Does not feel that she can afford membership at a gym due to being out of work. Reports PCP  checked her cholesterol on 03/21/21 and her LDL was high. She was encouraged by PCP to work on better diet and weight loss. She became tearful and states she feels like she is a "wreck" today; reassurance provided.  Past Medical History:  Diagnosis Date   Abrasion of right leg 04/07/2017   Dental crowns present    History of CVA (cerebrovascular accident) 2011   no deficits   Migraine    Migraines    PFO (patent foramen ovale)    PONV (postoperative nausea and vomiting)    Proximal phalanx fracture of finger 03/2017   left small   Runny nose 04/07/2017   clear drainage, per pt.    Past Surgical History:  Procedure Laterality Date   CESAREAN SECTION  04/25/2009   CLOSED REDUCTION FINGER WITH PERCUTANEOUS PINNING Left 04/09/2017   Procedure: LEFT SMALL FINGER CLOSED REDUCTION WITH PERCUTANEOUS PINNING;  Surgeon: Leanora Cover, MD;  Location: [facility];  Service: Orthopedics;  Laterality: Left;   MASTOIDECTOMY     PATENT FORAMEN OVALE(PFO) CLOSURE N/A 04/16/2018   Procedure: PATENT FORAMEN OVALE (PFO) CLOSURE;  Surgeon: Sherren Mocha, MD;  Location: [facility];  Service: Cardiovascular;  Laterality: N/A;   TEE WITHOUT CARDIOVERSION N/A 08/24/2013   Procedure: TRANSESOPHAGEAL ECHOCARDIOGRAM (TEE);  Surgeon: Larey Dresser, MD;  Location: [facility]OS[facility];  Service: Cardiovascular;  Laterality: N/A;   TMJ ARTHROPLASTY     TYMPANOSTOMY TUBE PLACEMENT Bilateral     Current Medications: Current Meds  Medication Sig   acetaminophen (TYLENOL) 500 MG tablet Take 500 mg by mouth every 6 (six) hours as needed.   aspirin 81 MG tablet Take 81 mg by mouth daily. Take 2 low dose asprin daily   buPROPion (WELLBUTRIN XL) 150 MG 24 hr tablet Take 150 mg by mouth every morning.   cetirizine (ZYRTEC) 10 MG tablet Take 1 tablet (10 mg total) by mouth daily.   Coenzyme Q10 (CO Q 10 PO) Take 1 tablet by mouth daily.   escitalopram (LEXAPRO) 5 MG tablet Take 5 mg by mouth daily.    famotidine (PEPCID) 20 MG tablet Take 20 mg by mouth at bedtime.   gabapentin (NEURONTIN) 300 MG capsule Take 1 capsule by mouth in the morning, at noon, and at bedtime.   HYDROcodone-acetaminophen (NORCO) 7.5-325 MG tablet SMARTSIG:1 Tablet(s) By Mouth Every 12 Hours PRN   MAGNESIUM PO Take 500 mg by mouth daily.    ondansetron (ZOFRAN) 4 MG tablet Take 1 tablet by mouth every 12 hours as needed for Nausea / Vomiting (due to migraine).   QULIPTA 10 MG TABS Take 1 tablet by mouth daily.   rosuvastatin (CRESTOR) 10 MG tablet Take 1 tablet (10 mg total) by mouth daily.   tiZANidine (ZANAFLEX) 4 MG tablet Take 1 tablet by mouth 3 times daily as needed for muscle spasms   Ubrogepant (UBRELVY) 50 MG TABS Take 1 tablet (50 mg total) by mouth every 2 (two) hours as needed for Migraine. Do not exceed over two tablets in a 24 hour day.   zonisamide (ZONEGRAN) 100 MG capsule Take 200 mg by mouth at bedtime.   [DISCONTINUED] propranolol ER (INDERAL LA) 60 MG 24 hr capsule Take 60 mg by mouth daily.     Allergies:   Ivp dye [iodinated diagnostic agents], Penicillins, Sulfa antibiotics, and Tape   Social History   Socioeconomic History   Marital status: Single    Spouse name: Not on file   Number of children: 0   Years of education: Not on file   Highest education level: Not on file  Occupational History   Occupation: ultrasound tech    Employer: [employer]  Tobacco Use   Smoking status: Never   Smokeless tobacco: Never  Vaping Use   Vaping Use: Never used  Substance and Sexual Activity   Alcohol use: No    Comment: occasionally   Drug use: No   Sexual activity: Yes  Other Topics Concern   Not on file  Social History Narrative   Not on file   Social Determinants of Health   Financial Resource Strain: Not on file  Food Insecurity: Not on file  Transportation Needs: Not on file  Physical Activity: Not on file  Stress: Not on file  Social Connections: Not on file     Family  History: The patient's family history includes CAD in her father; Cancer (age of onset: 65) in her mother; Heart attack in her father, maternal grandfather, maternal grandmother, paternal grandfather, and paternal grandmother.  ROS:   Please see the history of present illness.    ++left shoulder pain All other systems reviewed and are negative.  Labs/Other Studies Reviewed:    The following studies were reviewed today:  Cor CT 4/21  IMPRESSION: 1. No evidence of CAD, CADRADS = 0. 2. Coronary calcium score of 0. This was 0 percentile for age and sex matched control. 3. Normal coronary origin with right dominance. 4.  Normal appearance of 18 mm Amplatzer PFO  occluder device.    Stress Echo 10/19  Blood pressure demonstrated a hypertensive response to exercise. There was no ST segment deviation noted during stress. No EKG criterial of ischemia. Occasional PVCs noted. Normal exercise capacity achieving 11.4 mets at an adequate heart rate response of 100% MPHR. This is a low risk study    Cardiac Monitor 10/19  Sinus rhythm with frequent isolated PVC's (sometimes is bigeminy and trigeminy) and a burden during the monitoring period of 12.6%.   TEE 5/15  Study Conclusions   - Left ventricle: The cavity size was normal. Wall thickness    was normal. Systolic function was normal. The estimated    ejection fraction was in the range of 60% to 65%. Wall    motion was normal; there were no regional wall motion    abnormalities.  - Aortic valve: There was no stenosis.  - Mitral valve: Trivial regurgitation.  - Left atrium: No evidence of thrombus in the atrial cavity    or appendage.  - Right ventricle: The cavity size was normal. Systolic    function was normal.  - Right atrium: No evidence of thrombus in the atrial cavity    or appendage.  - Atrial septum: There was a moderate-sized PFO present with    bubbles crossing right to left at rest.    Recent Labs:  Recent Lipid  Panel    Component Value Date/Time   CHOL 176 07/27/2019 1228   TRIG 118 07/27/2019 1228   HDL 50 07/27/2019 1228   CHOLHDL 3.5 07/27/2019 1228   LDLCALC 105 (H) 07/27/2019 1228   Patient shared results from PCP on her cell phone: 03/21/21 LDL 163  Hdl 49 Trig 182   Physical Exam:    VS:  BP (!) 132/91 (BP Location: Right Arm, Patient Position: Sitting, Cuff Size: Large)   Pulse 69   Ht 5\' 9"  (1.753 m)   Wt 208 lb 11.2 oz (94.7 kg)   BMI 30.82 kg/m     Wt Readings from Last 3 Encounters:  03/27/21 208 lb 11.2 oz (94.7 kg)  06/06/19 197 lb (89.4 kg)  06/17/18 183 lb 6.4 oz (83.2 kg)     GEN:  Well nourished, well developed in no acute distress HEENT: Normal NECK: No JVD; No carotid bruits LYMPHATICS: No lymphadenopathy CARDIAC: RRR, no murmurs, rubs, gallops RESPIRATORY:  Clear to auscultation without rales, wheezing or rhonchi  ABDOMEN: Soft, non-tender, non-distended MUSCULOSKELETAL:  No edema; No deformity  SKIN: Warm and dry NEUROLOGIC:  Alert and oriented x 3 PSYCHIATRIC:  Normal affect   EKG:  EKG is ordered today.  The ekg ordered today demonstrates NSR at rate of 69 bpm, no ST/T wave abnormalities  Diagnoses:    1. Hyperlipidemia LDL goal <70   2. Essential hypertension   3. Heart palpitations    Assessment and Plan:     Chest pain: She has had 2 episodes of squeezing chest pain in the last few months. She believes symptoms are related to frozen shoulder that occurred following her Covid-19 injection in April 2022. She is seeking treatment for the shoulder. Advised her to continue to monitor CP, particularly as she hopefully becomes more active. Coronary calcium score April 2021 was 0. Encouraged her to increase physical activity to 150 minutes moderate exercise each week. Encouraged her to call back or seek immediate medical attention if chest pain worsens. Low suspicion for angina, do not feel that further ischemia evaluation is warranted at this time.  Continue  beta blocker.   Hyperlipidemia goal <70: LDL 163 03/21/21. Per patient, Dr. Debara Pickett wanted her to achieve LDL < 70 due to hx of CVA. She did not tolerate atorvastatin due to myalgias. Is willing to try rosuvastatin 20 mg. Encouraged 150 min of moderate activity each week, low cholesterol diet, and weight loss. Will recheck lipid/liver panel in 2 months.   Essential hypertension: BP is a little elevated in the office today. She reports she consistently gets readings of 120/70s at home. Became dizzy on propranolol 80 mg daily. Tolerating propranolol 60 mg without problem. States she eats a healthy diet but does not get any exercise. Offered referral to PREP. She will call back if she decides to pursue.   S/p PFO Closure/Hx of CVA: She has had 2 episodes of chest squeezing as noted above. She does not feel that symptoms are related to her heart. Last echo 2/21 showed stable PFO closure functioning normally. Continue aspirin.   Disposition: Recheck liver/lipid in 2 months. F/u 6 mo with Dr. Debara Pickett   Medication Adjustments/Labs and Tests Ordered: Current medicines are reviewed at length with the patient today.  Concerns regarding medicines are outlined above.  Orders Placed This Encounter  Procedures   Lipid panel   Hepatic function panel   EKG 12-Lead    Meds ordered this encounter  Medications   rosuvastatin (CRESTOR) 10 MG tablet    Sig: Take 1 tablet (10 mg total) by mouth daily.    Dispense:  90 tablet    Refill:  3    Order Specific Question:   Supervising Provider    Answer:   Acie Fredrickson, PHILIP J [[id number]]   propranolol ER (INDERAL LA) 60 MG 24 hr capsule    Sig: Take 1 capsule (60 mg total) by mouth daily.    Dispense:  90 capsule    Refill:  3    Order Specific Question:   Supervising Provider    Answer:   Thayer Headings [id number]     Patient Instructions  Medication Instructions:  Your physician recommends that you continue on your current medications as directed. Please  refer to the Current Medication list given to you today.  START Rosuvastatin 10 mg daily  *If you need a refill on your cardiac medications before your next appointment, please call your pharmacy*   Lab Work: Your physician recommends that you return for a FASTING lipid profile and liver function test in 2 months.  Your provider has recommended lab work. Please have this collected at [facility] at [place]. The lab is open 8:00 am - 4:30 pm. Please avoid 12:00p - 1:00p for lunch hour. You do not need an appointment. Please go to [address] [place], [place] [address]. This is in the Primary Care office on the 3rd floor, let them know you are there for blood work and they will direct you to the lab.    Testing/Procedures: Please call our office if you would like a referral to the PREP program.      Provider Referral Exercise Program (P.R.E.P.)      12 Weeks to Wellness       What is included in the [place] and personalized exercise prescription --Full Membership to participating [facility] for the 12 weeks --Pre- and Post-consultations to assess progress and formulate an exercise plan for       continuation of exercise   What is my investment?  Your cost is $100 (reg. $144). This includes  full membership privileges to [facility] in [place] and across the country. If you complete the post-assessment visit, any applicable enrollment costs will be waived if you decide to join the [facility].   Who will benefit from PREP?  People with challenges, including (but not limited to): ---Low back pain ---Arthritis ---Hypertension ---Diabetes ---Obesity ---Joint Replacement ---Neuromuscular Disorders ---Cancer Recovery ---Weight Loss ---Many others (as determined by provider)   Get Started Today! Ask your healthcare provider to fill out a Healthcare Provider Referral for Exercise form. Be sure to turn it in before you leave the office and  send/fax/email it directly to the Wellness RN to schedule your Initial Consultation. If you have family or friends that would also benefit, please share this referral with them.   Contacts:  [facility] [phone]    Winifred Olive, Wellness RN  [phone]  [email or web address]   Follow-Up: At [facility], you and your health needs are our priority.  As part of our continuing mission to provide you with exceptional heart care, we have created designated Provider Care Teams.  These Care Teams include your primary Cardiologist (physician) and Advanced Practice Providers (APPs -  Physician Assistants and Nurse Practitioners) who all work together to provide you with the care you need, when you need it.  We recommend signing up for the patient portal called "MyChart".  Sign up information is provided on this After Visit Summary.  MyChart is used to connect with patients for Virtual Visits (Telemedicine).  Patients are able to view lab/test results, encounter notes, upcoming appointments, etc.  Non-urgent messages can be sent to your provider as well.   To learn more about what you can do with MyChart, go to [email or web address].    Your next appointment:   6 month(s)  The format for your next appointment:   In Person  Provider:   Pixie Casino, MD {     Signed, Emmaline Life, NP  03/27/2021 1:48 PM    [place]

## 2021-03-27 ENCOUNTER — Other Ambulatory Visit: Payer: Self-pay

## 2021-03-27 ENCOUNTER — Ambulatory Visit (HOSPITAL_BASED_OUTPATIENT_CLINIC_OR_DEPARTMENT_OTHER): Payer: Medicaid Other | Admitting: Nurse Practitioner

## 2021-03-27 ENCOUNTER — Encounter (HOSPITAL_BASED_OUTPATIENT_CLINIC_OR_DEPARTMENT_OTHER): Payer: Self-pay | Admitting: Nurse Practitioner

## 2021-03-27 VITALS — BP 132/91 | HR 69 | Ht 69.0 in | Wt 208.7 lb

## 2021-03-27 DIAGNOSIS — I10 Essential (primary) hypertension: Secondary | ICD-10-CM

## 2021-03-27 DIAGNOSIS — E785 Hyperlipidemia, unspecified: Secondary | ICD-10-CM | POA: Diagnosis not present

## 2021-03-27 DIAGNOSIS — R002 Palpitations: Secondary | ICD-10-CM | POA: Diagnosis not present

## 2021-03-27 MED ORDER — PROPRANOLOL HCL ER 60 MG PO CP24: 60.0000 mg | ORAL_CAPSULE | Freq: Every day | ORAL | 3 refills | Status: AC

## 2021-03-27 MED ORDER — ROSUVASTATIN CALCIUM 10 MG PO TABS: 10.0000 mg | ORAL_TABLET | Freq: Every day | ORAL | 3 refills | Status: AC

## 2021-03-27 NOTE — Patient Instructions (Signed)
Medication Instructions:  Your physician recommends that you continue on your current medications as directed. Please refer to the Current Medication list given to you today.  START Rosuvastatin 10 mg daily  *If you need a refill on your cardiac medications before your next appointment, please call your pharmacy*   Lab Work: Your physician recommends that you return for a FASTING lipid profile and liver function test in 2 months.  Your provider has recommended lab work. Please have this collected at [facility] at [place]. The lab is open 8:00 am - 4:30 pm. Please avoid 12:00p - 1:00p for lunch hour. You do not need an appointment. Please go to [address] [place], [place] [address]. This is in the Primary Care office on the 3rd floor, let them know you are there for blood work and they will direct you to the lab.    Testing/Procedures: Please call our office if you would like a referral to the PREP program.      Provider Referral Exercise Program (P.R.E.P.)      12 Weeks to Wellness       What is included in the [place] and personalized exercise prescription --Full Membership to participating [facility] for the 12 weeks --Pre- and Post-consultations to assess progress and formulate an exercise plan for       continuation of exercise   What is my investment?  Your cost is $100 (reg. $144). This includes full membership privileges to [facility] in [place] and across the country. If you complete the post-assessment visit, any applicable enrollment costs will be waived if you decide to join the [facility].   Who will benefit from PREP?  People with challenges, including (but not limited to): ---Low back pain ---Arthritis ---Hypertension ---Diabetes ---Obesity ---Joint Replacement ---Neuromuscular Disorders ---Cancer Recovery ---Weight Loss ---Many others (as determined by provider)   Get Started Today! Ask your healthcare provider to  fill out a Healthcare Provider Referral for Exercise form. Be sure to turn it in before you leave the office and send/fax/email it directly to the Wellness RN to schedule your Initial Consultation. If you have family or friends that would also benefit, please share this referral with them.   Contacts:  [facility] [phone]    Winifred Olive, Wellness RN  [phone]  [email or web address]   Follow-Up: At [facility], you and your health needs are our priority.  As part of our continuing mission to provide you with exceptional heart care, we have created designated Provider Care Teams.  These Care Teams include your primary Cardiologist (physician) and Advanced Practice Providers (APPs -  Physician Assistants and Nurse Practitioners) who all work together to provide you with the care you need, when you need it.  We recommend signing up for the patient portal called "MyChart".  Sign up information is provided on this After Visit Summary.  MyChart is used to connect with patients for Virtual Visits (Telemedicine).  Patients are able to view lab/test results, encounter notes, upcoming appointments, etc.  Non-urgent messages can be sent to your provider as well.   To learn more about what you can do with MyChart, go to [email or web address].    Your next appointment:   6 month(s)  The format for your next appointment:   In Person  Provider:   Pixie Casino, MD {

## 2021-06-18 ENCOUNTER — Other Ambulatory Visit (HOSPITAL_COMMUNITY): Payer: Self-pay

## 2021-06-18 MED ORDER — UBRELVY 50 MG PO TABS
ORAL_TABLET | ORAL | 3 refills | Status: DC
  Filled 2021-06-18: qty 16, 34d supply, fill #0
  Filled 2021-07-18: qty 30, 30d supply, fill #1

## 2021-06-19 ENCOUNTER — Other Ambulatory Visit (HOSPITAL_COMMUNITY): Payer: Self-pay

## 2021-06-25 ENCOUNTER — Other Ambulatory Visit (HOSPITAL_COMMUNITY): Payer: Self-pay

## 2021-06-25 MED ORDER — METHYLPREDNISOLONE 4 MG PO TBPK
ORAL_TABLET | ORAL | 0 refills | Status: DC
  Filled 2021-06-25: qty 21, 6d supply, fill #0

## 2021-07-03 ENCOUNTER — Other Ambulatory Visit (HOSPITAL_COMMUNITY): Payer: Self-pay

## 2021-07-18 ENCOUNTER — Other Ambulatory Visit (HOSPITAL_COMMUNITY): Payer: Self-pay

## 2021-07-19 ENCOUNTER — Other Ambulatory Visit (HOSPITAL_COMMUNITY): Payer: Self-pay

## 2021-09-12 ENCOUNTER — Telehealth: Payer: Self-pay

## 2021-09-12 NOTE — Telephone Encounter (Signed)
Primary Cardiologist:Kenneth C Hilty, MD  Chart reviewed as part of pre-operative protocol coverage. Because of [place] past medical history and time since last visit, he/she will require a follow-up visit in order to better assess preoperative cardiovascular risk.  Pre-op covering staff: - Please schedule appointment and call patient to inform them. - Please contact requesting surgeon's office via preferred method (i.e, phone, fax) to inform them of need for appointment prior to surgery.  If applicable, this message will also be routed to pharmacy pool and/or primary cardiologist for input on holding anticoagulant/antiplatelet agent as requested below so that this information is available at time of patient's appointment.   Deberah Pelton, NP  09/12/2021, 2:46 PM

## 2021-09-12 NOTE — Telephone Encounter (Signed)
   Pre-operative Risk Assessment    Patient Name: Christina Goodman  DOB: June 02, 1970 MRN: [id number]      Request for Surgical Clearance    Procedure:   LEFT SHOULDER SCOPE   Date of Surgery:  Clearance TBD                                 Surgeon:  Ophelia Charter, M.D. Surgeon's Group or Practice Name:  [facility]  Phone number:  [phone] Fax number:  [phone]   Type of Clearance Requested:   - Pharmacy:  Hold Aspirin     Type of Anesthesia:  General  w/scalene block   Additional requests/questions:    Signed, Zebedee Iba   09/12/2021, 2:29 PM

## 2021-09-13 NOTE — Telephone Encounter (Signed)
Pt has been scheduled to see Coletta Memos, NP, 09/23/21, clearance will be addressed at that time.  Will route back to the requesting surgeon's office to make them aware.

## 2021-09-17 NOTE — Progress Notes (Unsigned)
Cardiology Clinic Note   Patient Name: Christina Goodman Date of Encounter: 09/23/2021  Primary Care Provider:  Marda Stalker, PA-C Primary Cardiologist:  Pixie Casino, MD  Patient Profile    Christina Goodman 51 year old female presents to the clinic today for follow-up evaluation of her PFO, palpitations, and preoperative cardiac evaluation.  Past Medical History    Past Medical History:  Diagnosis Date   Abrasion of right leg 04/07/2017   Dental crowns present    History of CVA (cerebrovascular accident) 2011   no deficits   Migraine    Migraines    PFO (patent foramen ovale)    PONV (postoperative nausea and vomiting)    Proximal phalanx fracture of finger 03/2017   left small   Runny nose 04/07/2017   clear drainage, per pt.   Past Surgical History:  Procedure Laterality Date   CESAREAN SECTION  04/25/2009   CLOSED REDUCTION FINGER WITH PERCUTANEOUS PINNING Left 04/09/2017   Procedure: LEFT SMALL FINGER CLOSED REDUCTION WITH PERCUTANEOUS PINNING;  Surgeon: Leanora Cover, MD;  Location: [facility];  Service: Orthopedics;  Laterality: Left;   MASTOIDECTOMY     PATENT FORAMEN OVALE(PFO) CLOSURE N/A 04/16/2018   Procedure: PATENT FORAMEN OVALE (PFO) CLOSURE;  Surgeon: Sherren Mocha, MD;  Location: [facility];  Service: Cardiovascular;  Laterality: N/A;   TEE WITHOUT CARDIOVERSION N/A 08/24/2013   Procedure: TRANSESOPHAGEAL ECHOCARDIOGRAM (TEE);  Surgeon: Larey Dresser, MD;  Location: [facility];  Service: Cardiovascular;  Laterality: N/A;   TMJ ARTHROPLASTY     TYMPANOSTOMY TUBE PLACEMENT Bilateral     Allergies  Allergies  Allergen Reactions   Ivp Dye [Iodinated Contrast Media] Hives   Penicillins Hives    Childhood Has patient had a PCN reaction causing immediate rash, facial/tongue/throat swelling, SOB or lightheadedness with hypotension: No Has patient had a PCN reaction causing severe rash involving mucus membranes or skin  necrosis: Yes Has patient had a PCN reaction that required hospitalization: No Has patient had a PCN reaction occurring within the last 10 years: No If all of the above answers are "NO", then may proceed with Cephalosporin use.    Sulfa Antibiotics Hives   Tape Other (See Comments)    BLOOD BLISTERS    History of Present Illness    Christina Goodman is a PMH of migraine headache, CVA, PFO with atrial septal aneurysm, palpitations.  She is status post PFO closure 12/19.  Her PMH also includes PCOS on metformin, and chest discomfort.  She was seen and evaluated by Nell Range, PA-C 1/20.  This was a follow-up for her PFO closure.  At that time she had complaints of exertional chest pain.  A stress echo was discussed.  She did not present for follow-up evaluation.  A surveillance bubble study echocardiogram 05/27/2019 showed an LVEF of 60-65%, G1 DD, and PFO device without intra atrial shunting.  She underwent ischemic evaluation via ETT 10/19 to evaluate chest discomfort.  That showed no ischemic EKG changes.  She was seen in follow-up by Roby Lofts, PA-C 06/06/2019.  She reported that over the past couple weeks she noticed intermittent chest discomfort.  Initially the discomfort started on her left side and would last for couple minutes then resolve spontaneously.  It occurred at rest.  She was experiencing central chest tightness which occurred with activity at the time of evaluation.  She indicated that the discomfort would last for more than a few minutes but less than an hour  resolved without intervention.  She had no associated symptoms.  She was placed on Pepcid without relief.  She feels that she had gained weight recently.  She reported increased stress with working at [employer] well parenting her 90 year old daughter.  She reported palpitations that were well controlled with propranolol.  She occasionally noted dizziness that was consistent with vertigo.  She did note  daytime somnolence.  She denied lightheadedness, syncope, lower extremity swelling, orthopnea, PND, and snoring.  A coronary CTA was Ordered and showed a coronary calcium score of 0.  She presents to the clinic today for follow-up evaluation states she has been noticing some dizziness and is planning to go to ENT in [place].  She has remained physically active walking 3-4 times per week for 30 to 45 minutes.  She reports that her propranolol continues to work well and she notices only occasional PVCs.  Her EKG today shows sinus rhythm 75 bpm.  Her blood pressure is well controlled.  We reviewed her last echocardiogram.  She expressed understanding.  I will give her the Epley maneuver instructions, continue her current medication regimen, have her maintain her physical activity, and plan follow-up in 12 months.  Today she denies chest pain, shortness of breath, lower extremity edema, fatigue, palpitations, melena, hematuria, hemoptysis, diaphoresis, weakness, presyncope, syncope, orthopnea, and PND.   Home Medications    Prior to Admission medications   Medication Sig Start Date End Date Taking? Authorizing Provider  acetaminophen (TYLENOL) 500 MG tablet Take 500 mg by mouth every 6 (six) hours as needed.    [provider]  aspirin 81 MG tablet Take 81 mg by mouth daily. Take 2 low dose asprin daily    [provider]  buPROPion (WELLBUTRIN XL) 150 MG 24 hr tablet Take 150 mg by mouth every morning. 02/28/21   [provider]  cetirizine (ZYRTEC) 10 MG tablet Take 1 tablet (10 mg total) by mouth daily. 06/17/18   Tenna Delaine D, PA-C  Coenzyme Q10 (CO Q 10 PO) Take 1 tablet by mouth daily.    [provider]  escitalopram (LEXAPRO) 5 MG tablet Take 5 mg by mouth daily. 02/28/21   [provider]  famotidine (PEPCID) 20 MG tablet Take 20 mg by mouth at bedtime. 02/28/21   [provider]  gabapentin (NEURONTIN) 300 MG capsule Take 1  capsule by mouth in the morning, at noon, and at bedtime. 11/26/20   [provider]  HYDROcodone-acetaminophen (NORCO) 7.5-325 MG tablet SMARTSIG:1 Tablet(s) By Mouth Every 12 Hours PRN 12/25/20   [provider]  MAGNESIUM PO Take 500 mg by mouth daily.     [provider]  methylPREDNISolone (MEDROL) 4 MG TBPK tablet Take 6 tablets 1st day, 5 tablets 2nd day, 4 tablets 3rd day, 3 tablets 4th day, 2 tablets 5th day, 1 tablet the 6th day 06/25/21     ondansetron (ZOFRAN) 4 MG tablet Take 1 tablet by mouth every 12 hours as needed for Nausea / Vomiting (due to migraine). 11/29/20     propranolol ER (INDERAL LA) 60 MG 24 hr capsule Take 1 capsule (60 mg total) by mouth daily. 03/27/21   Swinyer, Lanice Schwab, NP  QULIPTA 10 MG TABS Take 1 tablet by mouth daily. 03/15/21   [provider]  rosuvastatin (CRESTOR) 10 MG tablet Take 1 tablet (10 mg total) by mouth daily. 03/27/21   Swinyer, Lanice Schwab, NP  tiZANidine (ZANAFLEX) 4 MG tablet Take 1 tablet by  mouth 3 times daily as needed for muscle spasms 11/29/20     Ubrogepant (UBRELVY) 50 MG TABS Take 1 tablet (50 mg total) by mouth every 2 (two) hours as needed for Migraine. Do not exceed over two tablets in a 24 hour day. 12/10/20     Ubrogepant (UBRELVY) 50 MG TABS Take 1 tablet (50 mg total) by mouth every 2 (two) hours as needed for Migraine. 06/18/21     zonisamide (ZONEGRAN) 100 MG capsule Take 200 mg by mouth at bedtime. 03/22/21   [provider]    Family History    Family History  Problem Relation Age of Onset   Cancer Mother 30   CAD Father    Heart attack Father    Heart attack Paternal Grandfather    Heart attack Paternal Grandmother    Heart attack Maternal Grandfather    Heart attack Maternal Grandmother    She indicated that her mother is alive. She indicated that her father is alive. She indicated that her maternal grandmother is deceased. She indicated that her maternal grandfather is deceased.  She indicated that her paternal grandmother is deceased. She indicated that her paternal grandfather is deceased.  Social History    Social History   Socioeconomic History   Marital status: Single    Spouse name: Not on file   Number of children: 0   Years of education: Not on file   Highest education level: Not on file  Occupational History   Occupation: ultrasound tech    Employer: [employer]  Tobacco Use   Smoking status: Never   Smokeless tobacco: Never  Vaping Use   Vaping Use: Never used  Substance and Sexual Activity   Alcohol use: No    Comment: occasionally   Drug use: No   Sexual activity: Yes  Other Topics Concern   Not on file  Social History Narrative   Not on file   Social Determinants of Health   Financial Resource Strain: Not on file  Food Insecurity: Not on file  Transportation Needs: Not on file  Physical Activity: Not on file  Stress: Not on file  Social Connections: Not on file  Intimate Partner Violence: Not on file     Review of Systems    General:  No chills, fever, night sweats or weight changes.  Cardiovascular:  No chest pain, dyspnea on exertion, edema, orthopnea, palpitations, paroxysmal nocturnal dyspnea. Dermatological: No rash, lesions/masses Respiratory: No cough, dyspnea Urologic: No hematuria, dysuria Abdominal:   No nausea, vomiting, diarrhea, bright red blood per rectum, melena, or hematemesis Neurologic:  No visual changes, wkns, changes in mental status. All other systems reviewed and are otherwise negative except as noted above.  Physical Exam    VS:  BP 120/78   Pulse 75   Ht '5\' 9"'$  (1.753 m)   Wt 196 lb 12.8 oz (89.3 kg)   SpO2 99%   BMI 29.06 kg/m  , BMI Body mass index is 29.06 kg/m. GEN: Well nourished, well developed, in no acute distress. HEENT: normal. Neck: Supple, no JVD, carotid bruits, or masses. Cardiac: RRR, no murmurs, rubs, or gallops. No clubbing, cyanosis, edema.  Radials/DP/PT 2+ and equal  bilaterally.  Respiratory:  Respirations regular and unlabored, clear to auscultation bilaterally. GI: Soft, nontender, nondistended, BS + x 4. MS: no deformity or atrophy. Skin: warm and dry, no rash. Neuro:  Strength and sensation are intact. Psych: Normal affect.  Accessory Clinical Findings    Recent Labs: No results  found for requested labs within last 8760 hours.   Recent Lipid Panel    Component Value Date/Time   CHOL 176 07/27/2019 1228   TRIG 118 07/27/2019 1228   HDL 50 07/27/2019 1228   CHOLHDL 3.5 07/27/2019 1228   LDLCALC 105 (H) 07/27/2019 1228    ECG personally reviewed by me today-normal sinus rhythm with sinus arrhythmia possible left atrial enlargement 75 bpm- No acute changes  Echocardiogram 05/27/2019  IMPRESSIONS     1. Left ventricular ejection fraction, by visual estimation, is 60 to  65%. The left ventricle has normal function. There is no increased left  ventricular wall thickness.   2. Left ventricular diastolic parameters are consistent with Grade I  diastolic dysfunction (impaired relaxation).   3. Global right ventricle has normal systolc function.The right  ventricular size is normal. no increase in right ventricular wall  thickness.   4. 18 mm Amplatzer PFO device. No interatrial shunting.   5. The mitral valve is normal in structure. No evidence of mitral valve  regurgitation. No evidence of mitral stenosis.   6. The tricuspid valve was normal in structure. Tricuspid valve  regurgitation is not demonstrated.   7. Tricuspid valve regurgitation is not demonstrated.   8. No evidence of aortic valve sclerosis or stenosis.   9. The pulmonic valve was normal in structure.  10. The inferior vena cava is normal in size with greater than 50%  respiratory variability, suggesting right atrial pressure of 3 mmHg.   FINDINGS   Left Ventricle: Left ventricular ejection fraction, by visual estimation,  is 60 to 65%. The left ventricle has normal  function. There is no  increased left ventricular wall thickness. Left ventricular diastolic  parameters are consistent with Grade I  diastolic dysfunction (impaired relaxation). Normal left atrial pressure.   Right Ventricle: The right ventricular size is normal. No increase in  right ventricular wall thickness. Global RV systolic function is has  normal systolic function.   Left Atrium: Left atrial size was normal in size. 18 mm Amplatzer PFO  device. No interatrial shunting.   Right Atrium: Right atrial size was normal in size. Right atrial pressure  is estimated at 3 mmHg.   Pericardium: There is no evidence of pericardial effusion is seen. There  is no evidence of pericardial effusion.   Mitral Valve: The mitral valve is normal in structure. No evidence of  mitral valve stenosis by observation. MV Area by PHT, 3.48 cm. MV PHT,  63.22 msec. No evidence of mitral valve regurgitation.   Tricuspid Valve: The tricuspid valve is normal in structure. Tricuspid  valve regurgitation is not demonstrated.   Aortic Valve: The aortic valve is tricuspid. Aortic valve regurgitation is  not visualized. The aortic valve is structurally normal, with no evidence  of sclerosis or stenosis.   Pulmonic Valve: The pulmonic valve was normal in structure. Pulmonic valve  regurgitation is not visualized by color flow Doppler. Pulmonic  regurgitation is not visualized by color flow Doppler.   Aorta: The aortic root, ascending aorta and aortic arch are all  structurally normal, with no evidence of dilitation or obstruction.   Venous: The inferior vena cava is normal in size with greater than 50%  respiratory variability, suggesting right atrial pressure of 3 mmHg.   Shunts: Agitated saline contrast was given intravenously to evaluate for  intracardiac shunting. There is no evidence of a patent foramen ovale. No  ventricular septal defect is seen or detected. There is no evidence of  an  atrial  septal defect. No atrial  level shunt detected by color flow Doppler. Coronary CTA 07/29/2019  PROTOCOL: A 120 kV prospective scan was triggered in the descending thoracic aorta at 111 HU's. Axial non-contrast 3 mm slices were carried out through the heart. The data set was analyzed on a dedicated work station and scored using the [place]. Gantry rotation speed was 250 msecs and collimation was .6 mm. Beta blockade and 0.8 mg of sl NTG was given. The 3D data set was reconstructed in 5% intervals of the 67-82 % of the R-R cycle. Diastolic phases were analyzed on a dedicated work station using MPR, MIP and VRT modes. The patient received 44m OMNIPAQUE IOHEXOL 350 MG/ML SOLN of contrast.   FINDINGS: Coronary calcium score is 0, which places the patient in the 0 percentile for age and sex matched control.   Coronary arteries: Normal coronary origins.  Right dominance.   Right Coronary Artery: No detectable plaque or stenosis.   Left Main Coronary Artery: No detectable plaque or stenosis.   Left Anterior Descending Coronary Artery: No detectable plaque or stenosis.   Left Circumflex Artery: No detectable plaque or stenosis.   Aorta: Normal size, 33 mm at the mid ascending aorta (level of the PA bifurcation) measured double oblique. No calcifications. No dissection.   Aortic Valve: No calcifications.   S/p 18 mm Amplatzer PFO closure device. Appears well seated. No rocking or dehiscence on cine images.   Other findings:   Normal pulmonary vein drainage into the left atrium.   Normal left atrial appendage without a thrombus.   Normal size of the pulmonary artery.   IMPRESSION: 1. No evidence of CAD, CADRADS = 0.   2. Coronary calcium score of 0. This was 0 percentile for age and sex matched control.   3. Normal coronary origin with right dominance.   4.  Normal appearance of 18 mm Amplatzer PFO occluder device.     Electronically Signed   By: GCherlynn Kaiser  On: 08/01/2019 04:57  Assessment & Plan   1.  Palpitations-no recent episodes of accelerated or irregular heartbeat. Continue propranolol Heart healthy low-sodium diet-salty 6 given Increase physical activity as tolerated Avoid triggers caffeine, chocolate, EtOH, dehydration etc.  PFO-status post PFO closure 12/19.  Echocardiogram 05/27/2019 showed LVEF 60-65%, G1 DD and no interstitial shunting with 18 mm PFO device. Follows with structural heart team  Chest discomfort-no recent episodes of arm neck back or chest discomfort.  Coronary CTA 07/29/2019 showed a coronary calcium score of 0. Heart healthy low-sodium diet Increase physical activity as tolerated  Preoperative cardiac evaluation-left shoulder arthroscopy, Dr. VGriffin Basil    Primary Cardiologist: KPixie Casino MD  Chart reviewed as part of pre-operative protocol coverage. Given past medical history and time since last visit, based on ACC/AHA guidelines, SCHRISSA MEETZEwould be at acceptable risk for the planned procedure without further cardiovascular testing.   Patient was advised that if she develops new symptoms prior to surgery to contact our office to arrange a follow-up appointment.  He verbalized understanding.  Her aspirin is prescribed by a non cardiac provider.  Recommendations for holding aspirin will need to come from prescribing provider.  Her RCRI is a class III risk, 6.6% risk of major cardiac event.  She is able to complete greater than 4 METS of physical activity.   Disposition: Follow-up with Dr. HDebara Pickettin 1 year.   JJossie Ng Aujanae Mccullum NP-C    09/23/2021, 2:38  PM [facility] [address] Office [phone] Fax [phone]  Notice: This dictation was prepared with Dragon dictation along with smaller phrase technology. Any transcriptional errors that result from this process are unintentional and may not be corrected upon review.  I spent 14 minutes  examining this patient, reviewing medications, and using patient centered shared decision making involving her cardiac care.  Prior to her visit I spent greater than 20 minutes reviewing her past medical history,  medications, and prior cardiac tests.

## 2021-09-23 ENCOUNTER — Ambulatory Visit: Payer: Medicaid Other | Admitting: General Practice

## 2021-09-23 ENCOUNTER — Encounter: Payer: Self-pay | Admitting: General Practice

## 2021-09-23 VITALS — BP 120/78 | HR 75 | Ht 69.0 in | Wt 196.8 lb

## 2021-09-23 DIAGNOSIS — Z01810 Encounter for preprocedural cardiovascular examination: Secondary | ICD-10-CM | POA: Diagnosis not present

## 2021-09-23 DIAGNOSIS — R002 Palpitations: Secondary | ICD-10-CM | POA: Diagnosis not present

## 2021-09-23 DIAGNOSIS — R0789 Other chest pain: Secondary | ICD-10-CM

## 2021-09-23 DIAGNOSIS — Q2112 Patent foramen ovale: Secondary | ICD-10-CM | POA: Diagnosis not present

## 2021-09-23 NOTE — Patient Instructions (Signed)
Medication Instructions:  The current medical regimen is effective;  continue present plan and medications as directed. Please refer to the Current Medication list given to you today.   *If you need a refill on your cardiac medications before your next appointment, please call your pharmacy*  Lab Work:   Testing/Procedures:  NONE    NONE If you have labs (blood work) drawn today and your tests are completely normal, you will receive your results only by: [facility] (if you have MyChart) OR  A paper copy in the mail If you have any lab test that is abnormal or we need to change your treatment, we will call you to review the results.  Special Instructions CLEARED FOR UPCOMING SURGERY  PLEASE READ AND FOLLOW EPLEY MANEUVER-ATTACHED  Follow-Up: Your next appointment:  12 month(s) In Person with Pixie Casino, MD    Please call our office 2 months in advance to schedule this appointment   At [facility], you and your health needs are our priority.  As part of our continuing mission to provide you with exceptional heart care, we have created designated Provider Care Teams.  These Care Teams include your primary Cardiologist (physician) and Advanced Practice Providers (APPs -  Physician Assistants and Nurse Practitioners) who all work together to provide you with the care you need, when you need it.    Important Information About Sugar          How to Perform the Epley Maneuver The Epley maneuver is an exercise that relieves symptoms of vertigo. Vertigo is the feeling that you or your surroundings are moving when they are not. When you feel vertigo, you may feel like the room is spinning and may have trouble walking. The Epley maneuver is used for a type of vertigo caused by a calcium deposit in a part of the inner ear. The maneuver involves changing head positions to help the deposit move out of the area. You can do this maneuver at home whenever you have symptoms of vertigo.  You can repeat it in 24 hours if your vertigo has not gone away. Even though the Epley maneuver may relieve your vertigo for a few weeks, it is possible that your symptoms will return. This maneuver relieves vertigo, but it does not relieve dizziness. What are the risks? If it is done correctly, the Epley maneuver is considered safe. Sometimes it can lead to dizziness or nausea that goes away after a short time. If you develop other symptoms--such as changes in vision, weakness, or numbness--stop doing the maneuver and call your health care provider. Supplies needed: A bed or table. A pillow. How to do the Epley maneuver     Sit on the edge of a bed or table with your back straight and your legs extended or hanging over the edge of the bed or table. Turn your head halfway toward the affected ear or side as told by your health care provider. Lie backward quickly with your head turned until you are lying flat on your back. Your head should dangle (head-hanging position). You may want to position a pillow under your shoulders. Hold this position for at least 30 seconds. If you feel dizzy or have symptoms of vertigo, continue to hold the position until the symptoms stop. Turn your head to the opposite direction until your unaffected ear is facing down. Your head should continue to dangle. Hold this position for at least 30 seconds. If you feel dizzy or have symptoms of  vertigo, continue to hold the position until the symptoms stop. Turn your whole body to the same side as your head so that you are positioned on your side. Your head will now be nearly facedown and no longer needs to dangle. Hold for at least 30 seconds. If you feel dizzy or have symptoms of vertigo, continue to hold the position until the symptoms stop. Sit back up. You can repeat the maneuver in 24 hours if your vertigo does not go away. Follow these instructions at home: For 24 hours after doing the Epley maneuver: Keep your head  in an upright position. When lying down to sleep or rest, keep your head raised (elevated) with two or more pillows. Avoid excessive neck movements. Activity Do not drive or use machinery if you feel dizzy. After doing the Epley maneuver, return to your normal activities as told by your health care provider. Ask your health care provider what activities are safe for you. General instructions Drink enough fluid to keep your urine pale yellow. Do not drink alcohol. Take over-the-counter and prescription medicines only as told by your health care provider. Keep all follow-up visits. This is important. Preventing vertigo symptoms Ask your health care provider if there is anything you should do at home to prevent vertigo. He or she may recommend that you: Keep your head elevated with two or more pillows while you sleep. Do not sleep on the side of your affected ear. Get up slowly from bed. Avoid sudden movements during the day. Avoid extreme head positions or movement, such as looking up or bending over. Contact a health care provider if: Your vertigo gets worse. You have other symptoms, including: Nausea. Vomiting. Headache. Get help right away if you: Have vision changes. Have a headache or neck pain that is severe or getting worse. Cannot stop vomiting. Have new numbness or weakness in any part of your body. These symptoms may represent a serious problem that is an emergency. Do not wait to see if the symptoms will go away. Get medical help right away. Call your local emergency services (911 in the [place] Do not drive yourself to the hospital. Summary Vertigo is the feeling that you or your surroundings are moving when they are not. The Epley maneuver is an exercise that relieves symptoms of vertigo. If the Epley maneuver is done correctly, it is considered safe. This information is not intended to replace advice given to you by your health care provider. Make sure you discuss any  questions you have with your health care provider. Document Revised: 03/07/2020 Document Reviewed: 03/07/2020 Elsevier Patient Education  [place].

## 2021-09-25 ENCOUNTER — Emergency Department (HOSPITAL_BASED_OUTPATIENT_CLINIC_OR_DEPARTMENT_OTHER): Payer: Medicaid Other | Admitting: Radiology

## 2021-09-25 ENCOUNTER — Emergency Department (HOSPITAL_BASED_OUTPATIENT_CLINIC_OR_DEPARTMENT_OTHER)
Admission: EM | Admit: 2021-09-25 | Discharge: 2021-09-25 | Disposition: A | Discharge status: Home / Self Care | Payer: Medicaid Other | Attending: Emergency Medicine | Admitting: Emergency Medicine

## 2021-09-25 ENCOUNTER — Encounter (HOSPITAL_BASED_OUTPATIENT_CLINIC_OR_DEPARTMENT_OTHER): Payer: Self-pay

## 2021-09-25 ENCOUNTER — Other Ambulatory Visit: Payer: Self-pay

## 2021-09-25 DIAGNOSIS — R7989 Other specified abnormal findings of blood chemistry: Secondary | ICD-10-CM | POA: Insufficient documentation

## 2021-09-25 DIAGNOSIS — U071 COVID-19: Secondary | ICD-10-CM

## 2021-09-25 DIAGNOSIS — Z7982 Long term (current) use of aspirin: Secondary | ICD-10-CM | POA: Insufficient documentation

## 2021-09-25 DIAGNOSIS — J029 Acute pharyngitis, unspecified: Secondary | ICD-10-CM

## 2021-09-25 DIAGNOSIS — R059 Cough, unspecified: Secondary | ICD-10-CM | POA: Diagnosis present

## 2021-09-25 LAB — COMPREHENSIVE METABOLIC PANEL
ALT: 9 U/L (ref 0–44)
AST: 8 U/L — ABNORMAL LOW (ref 15–41)
Albumin: 4.3 g/dL (ref 3.5–5.0)
Alkaline Phosphatase: 65 U/L (ref 38–126)
Anion gap: 11 (ref 5–15)
BUN: 11 mg/dL (ref 6–20)
CO2: 22 mmol/L (ref 22–32)
Calcium: 9.7 mg/dL (ref 8.9–10.3)
Chloride: 105 mmol/L (ref 98–111)
Creatinine, Ser: 1.16 mg/dL — ABNORMAL HIGH (ref 0.44–1.00)
GFR, Estimated: 58 mL/min — ABNORMAL LOW (ref >60)
Glucose, Bld: 111 mg/dL — ABNORMAL HIGH (ref 70–99)
Potassium: 3.8 mmol/L (ref 3.5–5.1)
Sodium: 138 mmol/L (ref 135–145)
Total Bilirubin: 0.3 mg/dL (ref 0.3–1.2)
Total Protein: 7.3 g/dL (ref 6.5–8.1)

## 2021-09-25 LAB — CBC WITH DIFFERENTIAL/PLATELET
Abs Immature Granulocytes: 0.02 10*3/uL (ref 0.00–0.07)
Basophils Absolute: 0 10*3/uL (ref 0.0–0.1)
Basophils Relative: 1 %
Eosinophils Absolute: 0 10*3/uL (ref 0.0–0.5)
Eosinophils Relative: 0 %
HCT: 43.2 % (ref 36.0–46.0)
Hemoglobin: 13.7 g/dL (ref 12.0–15.0)
Immature Granulocytes: 0 %
Lymphocytes Relative: 9 %
Lymphs Abs: 0.8 10*3/uL (ref 0.7–4.0)
MCH: 27.2 pg (ref 26.0–34.0)
MCHC: 31.7 g/dL (ref 30.0–36.0)
MCV: 85.9 fL (ref 80.0–100.0)
Monocytes Absolute: 0.5 10*3/uL (ref 0.1–1.0)
Monocytes Relative: 6 %
Neutro Abs: 6.9 10*3/uL (ref 1.7–7.7)
Neutrophils Relative %: 84 %
Platelets: 295 10*3/uL (ref 150–400)
RBC: 5.03 MIL/uL (ref 3.87–5.11)
RDW: 14.5 % (ref 11.5–15.5)
WBC: 8.3 10*3/uL (ref 4.0–10.5)
nRBC: 0 % (ref 0.0–0.2)

## 2021-09-25 LAB — RESP PANEL BY RT-PCR (FLU A&B, COVID) ARPGX2
Influenza A by PCR: NEGATIVE
Influenza B by PCR: NEGATIVE
SARS Coronavirus 2 by RT PCR: POSITIVE — AB

## 2021-09-25 MED ORDER — BENZONATATE 100 MG PO CAPS: 100.0000 mg | ORAL_CAPSULE | Freq: Three times a day (TID) | ORAL | 0 refills | Status: DC

## 2021-09-25 MED ORDER — NIRMATRELVIR/RITONAVIR (PAXLOVID)TABLET: 1.0000 | ORAL_TABLET | Freq: Two times a day (BID) | ORAL | 0 refills | Status: AC

## 2021-09-25 MED ORDER — CETIRIZINE HCL 10 MG PO TABS: 10.0000 mg | ORAL_TABLET | Freq: Every day | ORAL | 0 refills | Status: AC

## 2021-09-25 NOTE — ED Notes (Signed)
Patient transported to X-ray 

## 2021-09-25 NOTE — ED Notes (Addendum)
Pt diaphoretic, cool and clamy, c/o generalized weakness

## 2021-09-25 NOTE — ED Notes (Signed)
Pt agreeable with d/c plan as discussed by provider- this nurse has verbally reinforced d/c instructions and provided pt with written copy - pt acknowledges verbal understanding and denies any additional questions, concerns, needs  

## 2021-09-25 NOTE — Discharge Instructions (Addendum)
3 prescriptions have been sent to your pharmacy, they are as follows: Paxlovid-an duo antiviral for COVID treatment.  Take 1 tablet of each antiviral every 12 hours for 5 days. Zyrtec-a decongestant.  Take 1 tablet daily for the next 7 to 10 days. Tessalon-cough suppressant.  Take 1 capsule every 8 hours for cough suppression over the next 7 days.  Do not take your Roselyn Meier while taking the antiviral medications.  Continue to utilize Tylenol and ibuprofen, tea, and honey.  Emphasis on fluid rehydration and rest.  Follow-up with your PCP within the next 3 days for reevaluation and continued medical management.  Return to the ED for new or worsening symptoms as discussed.

## 2021-09-25 NOTE — ED Triage Notes (Signed)
Pt c/o sore throat, cough and fever since yesterday. Pt states she took tylenol at 1600. Pt also recently passed a kidney stone recently. Denies urinary symptoms at this time. Highest temp at home was 103.7

## 2021-09-25 NOTE — ED Provider Notes (Signed)
[facility] Provider Note   CSN: [id number] Arrival date & time: 09/25/21  1628     History  Chief Complaint  Patient presents with   Cough   Fever   Sore Throat    Christina Goodman is a 51 y.o. female with chief complaint of sudden onset sore throat, mild productive cough, and fevers since last night.  Has been monitoring symptoms with alternating Tylenol and ibuprofen.  Recently passed kidney stone on Friday, chronic history of this.  Highest temperature at home was 103.7 using dermal thermometer on her forehead.  Denies N/V/D, with exception of one episode of possible emesis due to excessive coughing.  No known recent sick contacts.  Mild dysphagia due to sore throat, but still able to swallow food and fluids without difficulty.  Denies neck stiffness, shortness of breath, chest pain, abdominal pain, hemoptysis, or current urinary symptoms.  Hx of tonsillectomy.  The history is provided by the patient and medical records.  Cough Associated symptoms: fever   Fever Associated symptoms: cough   Sore Throat       Home Medications Prior to Admission medications   Medication Sig Start Date End Date Taking? Authorizing Provider  benzonatate (TESSALON) 100 MG capsule Take 1 capsule (100 mg total) by mouth every 8 (eight) hours. 09/25/21  Yes Prince Rome, PA-C  cetirizine (ZYRTEC ALLERGY) 10 MG tablet Take 1 tablet (10 mg total) by mouth daily. 09/25/21  Yes Prince Rome, PA-C  nirmatrelvir/ritonavir EUA (PAXLOVID) 20 x 150 MG & 10 x '100MG'$  TABS Take 1 tablet by mouth 2 (two) times daily for 5 days. Patient GFR is 58. Take nirmatrelvir (150 mg) two tablets twice daily for 5 days and ritonavir (100 mg) one tablet twice daily for 5 days. 09/25/21 09/30/21 Yes Prince Rome, PA-C  acetaminophen (TYLENOL) 500 MG tablet Take 500 mg by mouth every 6 (six) hours as needed. Patient not taking: Reported on 09/23/2021    [provider]  aspirin  81 MG tablet Take 81 mg by mouth daily. Take 2 low dose asprin daily    [provider]  buPROPion (WELLBUTRIN XL) 150 MG 24 hr tablet Take 150 mg by mouth every morning. Patient not taking: Reported on 09/23/2021 02/28/21   [provider]  Coenzyme Q10 (CO Q 10 PO) Take 1 tablet by mouth daily.    [provider]  escitalopram (LEXAPRO) 5 MG tablet Take 5 mg by mouth daily. 02/28/21   [provider]  famotidine (PEPCID) 20 MG tablet Take 20 mg by mouth at bedtime. 02/28/21   [provider]  gabapentin (NEURONTIN) 300 MG capsule Take 1 capsule by mouth in the morning, at noon, and at bedtime. 11/26/20   [provider]  HYDROcodone-acetaminophen ([place]) 7.5-325 MG tablet SMARTSIG:1 Tablet(s) By Mouth Every 12 Hours PRN Patient not taking: Reported on 09/23/2021 12/25/20   [provider]  MAGNESIUM PO Take 500 mg by mouth daily.     [provider]  methylPREDNISolone (MEDROL) 4 MG TBPK tablet Take 6 tablets 1st day, 5 tablets 2nd day, 4 tablets 3rd day, 3 tablets 4th day, 2 tablets 5th day, 1 tablet the 6th day Patient not taking: Reported on 09/23/2021 06/25/21     ondansetron (ZOFRAN) 4 MG tablet Take 1 tablet by mouth every 12 hours as needed for Nausea / Vomiting (due to migraine). 11/29/20     promethazine (PHENERGAN) 25 MG suppository 1 suppository 08/07/21   [provider]  propranolol ER (INDERAL LA) 60 MG 24 hr capsule Take 1 capsule (60 mg total) by mouth daily. 03/27/21   Swinyer, Lanice Schwab, NP  QULIPTA 10 MG TABS Take 1 tablet by mouth daily. 03/15/21   [provider]  rosuvastatin (CRESTOR) 10 MG tablet Take 1 tablet (10 mg total) by mouth daily. 03/27/21   Swinyer, Lanice Schwab, NP  tiZANidine (ZANAFLEX) 4 MG tablet Take 1 tablet by mouth 3 times daily as needed for muscle spasms 11/29/20     zonisamide (ZONEGRAN) 100 MG capsule Take 200 mg by mouth at bedtime. 03/22/21   [provider]       Allergies    Ivp dye [iodinated contrast media], Penicillins, Sulfa antibiotics, and Tape    Review of Systems   Review of Systems  Constitutional:  Positive for fever.  Respiratory:  Positive for cough.    Physical Exam Updated Vital Signs BP 136/90   Pulse 88   Temp 98.1 F (36.7 C) (Oral)   Resp 19   Ht '5\' 9"'$  (1.753 m)   Wt 86.2 kg   SpO2 100%   BMI 28.06 kg/m  Physical Exam Vitals and nursing note reviewed.  Constitutional:      General: She is not in acute distress.    Appearance: She is well-developed. She is ill-appearing. She is not toxic-appearing or diaphoretic.  HENT:     Head: Normocephalic and atraumatic.     Right Ear: Tympanic membrane and ear canal normal.     Left Ear: Tympanic membrane and ear canal normal.     Nose: Rhinorrhea (Clear) present. No congestion.     Mouth/Throat:     Mouth: Mucous membranes are moist.     Pharynx: Oropharynx is clear. Uvula midline. No oropharyngeal exudate or uvula swelling.     Tonsils: No tonsillar exudate or tonsillar abscesses.     Comments: Mild erythema of the posterior oropharynx.  No obvious exudate, swelling, petechia, mass, or deviation Eyes:     Conjunctiva/sclera: Conjunctivae normal.  Cardiovascular:     Rate and Rhythm: Normal rate and regular rhythm.     Heart sounds: Normal heart sounds. No murmur heard. Pulmonary:     Effort: Pulmonary effort is normal. No respiratory distress.     Breath sounds: Normal breath sounds. No wheezing or rales.     Comments: ABCs intact.  Able to communicate without difficulty.  No increased respiratory effort.  CTAB. Chest:     Chest wall: No tenderness.  Abdominal:     General: Bowel sounds are normal.     Palpations: Abdomen is soft.     Tenderness: There is no abdominal tenderness.  Musculoskeletal:        General: No swelling.     Cervical back: Neck supple.  Skin:    General: Skin is warm and dry.     Capillary Refill: Capillary refill takes less than 2  seconds.     Coloration: Skin is not pale.     Findings: No erythema or rash.  Neurological:     Mental Status: She is alert and oriented to person, place, and time.  Psychiatric:        Mood and Affect: Mood normal.    ED Results / Procedures / Treatments   Labs (all labs ordered are listed, but only abnormal results are displayed) Labs Reviewed  RESP PANEL BY RT-PCR (FLU A&B, COVID) ARPGX2 - Abnormal; Notable for the following components:  Result Value   SARS Coronavirus 2 by RT PCR POSITIVE (*)    All other components within normal limits  COMPREHENSIVE METABOLIC PANEL - Abnormal; Notable for the following components:   Glucose, Bld 111 (*)    Creatinine, Ser 1.16 (*)    AST 8 (*)    GFR, Estimated 58 (*)    All other components within normal limits  CBC WITH DIFFERENTIAL/PLATELET    EKG None  Radiology DG Chest 2 View  Result Date: 09/25/2021 CLINICAL DATA:  Productive cough. EXAM: CHEST - 2 VIEW COMPARISON:  Chest x-ray 02/24/2020 FINDINGS: Small intracardiac closure device is unchanged. The heart size and mediastinal contours are within normal limits. Both lungs are clear. The visualized skeletal structures are unremarkable. IMPRESSION: No active cardiopulmonary disease. Electronically Signed   By: Ronney Asters M.D.   On: 09/25/2021 17:08    Procedures Procedures    Medications Ordered in ED Medications - No data to display  ED Course/ Medical Decision Making/ A&P                           Medical Decision Making Amount and/or Complexity of Data Reviewed External Data Reviewed: notes. Labs: ordered. Decision-making details documented in ED Course. Radiology: ordered and independent interpretation performed. Decision-making details documented in ED Course. ECG/medicine tests: ordered and independent interpretation performed. Decision-making details documented in ED Course.  Risk OTC drugs. Prescription drug management.   51 y.o. female presents to the  ED for concern of Cough, Fever, and Sore Throat   This involves an extensive number of treatment options, and is a complaint that carries with it a high risk of complications and morbidity.     Past Medical History / Co-morbidities / Social History: Hx of migraines, CVA, palpitations, PFO with repair in 2022.  Additional History:  Internal and external records from outside source obtained and reviewed including cardiology, neurology  Physical Exam: Physical exam performed. The pertinent findings include: Mild erythema of the posterior oropharynx; cervical lymphadenopathy  Lab Tests: I ordered, and personally interpreted labs.  The pertinent results include:   CBC: Unremarkable CMP/BMP: Mild elevated creatinine 1.16, overall unremarkable COVID: Positive Influenza: Negative  Imaging Studies: I ordered imaging studies including CXR.  I independently visualized and interpreted said imaging.  Pertinent results include: No acute cardiopulmonary pathology I agree with the radiologist interpretation.  Medications: I have reviewed the patients home medicines and have made adjustments as needed  ED Course/Disposition: Pt well-appearing on exam.  Presents with sudden onset of cough, sore throat, subjective fevers, myalgias, and congestion.  Without shortness of breath, wheeze, or increased respiratory effort.  CXR negative for acute cardiopulmonary pathology.  Not suspicious of pneumonia or bronchitis at this time.  Pt afebrile, without tonsillar exudate, or tonsillar swelling.  Presentation non concerning for PTA or RPA.  No trismus, neck mass, or uvula deviation.  Pt able to drink water in ED without difficulty with intact air way.  Without significant N/V/D.  Negative influenza.  Presents with mild cervical lymphadenopathy, mildly productive cough, & dysphagia.  Covid positive.  Diagnosis of viral pharyngitis.  No abx indicated.  Pt presents within 24 hours of symptom onset.  Antiviral therapy  recommended and provided.  Pt does not appear dehydrated, but did discuss importance of water rehydration.  Recommended PCP follow up, antiviral therapy, and symptomatic management.  Pt in NAD and good condition at time of discharge.  After consideration of the diagnostic results  and the patient's encounter today, I feel that the emergency department workup does not suggest an emergent condition requiring admission or immediate intervention beyond what has been performed at this time.  The patient is safe for discharge and has been instructed to return immediately for worsening symptoms, change in symptoms or any other concerns.  Discussed course of treatment thoroughly with the patient, whom demonstrated understanding.  Patient in agreement and has no further questions.  I discussed this case with my attending physician Dr. Rogene Houston, who agreed with the proposed treatment course and cosigned this note including patient's presenting symptoms, physical exam, and planned diagnostics and interventions.  Attending physician stated agreement with plan or made changes to plan which were implemented.     This chart was dictated using voice recognition software.  Despite best efforts to proofread, errors can occur which can change the documentation meaning.         Final Clinical Impression(s) / ED Diagnoses Final diagnoses:  COVID-19  Viral pharyngitis    Rx / DC Orders ED Discharge Orders          Ordered    nirmatrelvir/ritonavir EUA (PAXLOVID) 20 x 150 MG & 10 x '100MG'$  TABS  2 times daily        09/25/21 1852    benzonatate (TESSALON) 100 MG capsule  Every 8 hours        09/25/21 1852    cetirizine (ZYRTEC ALLERGY) 10 MG tablet  Daily        09/25/21 1852              Prince Rome, Hershal Coria [birth year] 1727    Fredia Sorrow, MD 09/30/21 1103

## 2021-10-24 ENCOUNTER — Other Ambulatory Visit: Payer: Self-pay | Admitting: Family Medicine

## 2021-10-24 ENCOUNTER — Ambulatory Visit
Admission: RE | Admit: 2021-10-24 | Discharge: 2021-10-24 | Disposition: A | Discharge status: Home / Self Care | Payer: Medicaid Other | Source: Ambulatory Visit | Attending: Family Medicine | Admitting: Family Medicine

## 2021-10-24 DIAGNOSIS — R059 Cough, unspecified: Secondary | ICD-10-CM

## 2021-11-21 ENCOUNTER — Other Ambulatory Visit: Payer: Self-pay

## 2021-11-21 ENCOUNTER — Encounter (HOSPITAL_BASED_OUTPATIENT_CLINIC_OR_DEPARTMENT_OTHER): Payer: Self-pay | Admitting: Orthopaedic Surgery

## 2021-11-25 NOTE — H&P (Signed)
PREOPERATIVE H&P  Chief Complaint: left shoulder cartilage disorder, adhesive capsulitis  HPI: [facility] is a 51 y.o. female who is scheduled for, Procedure(s): ARTHROSCOPY SHOULDER/DEBRIDMENT LYSIS OF ADHESION/MANIPULATION Langston Masker.   Patient has a past medical history significant for stroke in 2010, HLD, PONV, PFO.   The patient is a 51 year old female who used to be an x-ray tech at [employer] who comes in with a chronic history of left shoulder pain.  It began in April of this year after the Covid vaccine.  She thought it would get better with time, but it has not.  She has seen Dr. Inez Catalina at [facility].  She has had two shoulder injections.  She has had no improvement with the shoulder injections.  She did have a C-spine epidural, which she noted did help a little bit with her pain in the shoulder that radiated down to her thumb and index finger.  She is frustrated by her left shoulder.  She is unable to do things.  She cannot use that left shoulder.  She feels like she has lost strength and is unable to do her normal activities.  She is right hand dominant.    Symptoms are rated as moderate to severe, and have been worsening.  This is significantly impairing activities of daily living.    Please see clinic note for further details on this patient's care.    She has elected for surgical management.   Past Medical History:  Diagnosis Date   Abrasion of right leg 04/07/2017   Dental crowns present    History of CVA (cerebrovascular accident) 2011   no deficits   Migraine    Migraines    PFO (patent foramen ovale)    PONV (postoperative nausea and vomiting)    Proximal phalanx fracture of finger 03/2017   left small   Runny nose 04/07/2017   clear drainage, per pt.   Past Surgical History:  Procedure Laterality Date   CESAREAN SECTION  04/25/2009   CLOSED REDUCTION FINGER WITH PERCUTANEOUS PINNING Left 04/09/2017   Procedure: LEFT SMALL FINGER CLOSED REDUCTION  WITH PERCUTANEOUS PINNING;  Surgeon: Leanora Cover, MD;  Location: [facility];  Service: Orthopedics;  Laterality: Left;   MASTOIDECTOMY     PATENT FORAMEN OVALE(PFO) CLOSURE N/A 04/16/2018   Procedure: PATENT FORAMEN OVALE (PFO) CLOSURE;  Surgeon: Sherren Mocha, MD;  Location: [facility];  Service: Cardiovascular;  Laterality: N/A;   TEE WITHOUT CARDIOVERSION N/A 08/24/2013   Procedure: TRANSESOPHAGEAL ECHOCARDIOGRAM (TEE);  Surgeon: Larey Dresser, MD;  Location: [facility] ENDOSCOPY;  Service: Cardiovascular;  Laterality: N/A;   TMJ ARTHROPLASTY     TYMPANOSTOMY TUBE PLACEMENT Bilateral    Social History   Socioeconomic History   Marital status: Single    Spouse name: Not on file   Number of children: 0   Years of education: Not on file   Highest education level: Not on file  Occupational History   Occupation: ultrasound tech    Employer: [employer]  Tobacco Use   Smoking status: Never   Smokeless tobacco: Never  Vaping Use   Vaping Use: Never used  Substance and Sexual Activity   Alcohol use: No    Comment: occasionally   Drug use: No   Sexual activity: Yes  Other Topics Concern   Not on file  Social History Narrative   Not on file   Social Determinants of Health   Financial Resource Strain: Not on file  Food Insecurity: Not on file  Transportation Needs: Not on file  Physical Activity: Not on file  Stress: Not on file  Social Connections: Not on file   Family History  Problem Relation Age of Onset   Cancer Mother 59   CAD Father    Heart attack Father    Heart attack Paternal Grandfather    Heart attack Paternal Grandmother    Heart attack Maternal Grandfather    Heart attack Maternal Grandmother    Allergies  Allergen Reactions   Ivp Dye [Iodinated Contrast Media] Hives   Penicillins Hives    Childhood Has patient had a PCN reaction causing immediate rash, facial/tongue/throat swelling, SOB or lightheadedness with hypotension: No Has  patient had a PCN reaction causing severe rash involving mucus membranes or skin necrosis: Yes Has patient had a PCN reaction that required hospitalization: No Has patient had a PCN reaction occurring within the last 10 years: No If all of the above answers are "NO", then may proceed with Cephalosporin use.    Sulfa Antibiotics Hives   Tape Other (See Comments)    BLOOD BLISTERS   Prior to Admission medications   Medication Sig Start Date End Date Taking? Authorizing Provider  aspirin 81 MG tablet Take 81 mg by mouth daily. Take 2 low dose asprin daily   Yes [provider]  cetirizine (ZYRTEC ALLERGY) 10 MG tablet Take 1 tablet (10 mg total) by mouth daily. 09/25/21  Yes Prince Rome, PA-C  Coenzyme Q10 (CO Q 10 PO) Take 1 tablet by mouth daily.   Yes [provider]  escitalopram (LEXAPRO) 5 MG tablet Take 5 mg by mouth daily. 02/28/21  Yes [provider]  famotidine (PEPCID) 20 MG tablet Take 20 mg by mouth at bedtime. 02/28/21  Yes [provider]  gabapentin (NEURONTIN) 300 MG capsule Take 1 capsule by mouth in the morning, at noon, and at bedtime. 11/26/20  Yes [provider]  MAGNESIUM PO Take 500 mg by mouth daily.    Yes [provider]  ondansetron (ZOFRAN) 4 MG tablet Take 1 tablet by mouth every 12 hours as needed for Nausea / Vomiting (due to migraine). 11/29/20  Yes   promethazine (PHENERGAN) 25 MG suppository 1 suppository 08/07/21  Yes [provider]  propranolol ER (INDERAL LA) 60 MG 24 hr capsule Take 1 capsule (60 mg total) by mouth daily. 03/27/21  Yes Swinyer, Lanice Schwab, NP  QULIPTA 10 MG TABS Take 1 tablet by mouth daily. 03/15/21  Yes [provider]  rosuvastatin (CRESTOR) 10 MG tablet Take 1 tablet (10 mg total) by mouth daily. 03/27/21  Yes Swinyer, Lanice Schwab, NP  tiZANidine (ZANAFLEX) 4 MG tablet Take 1 tablet by mouth 3 times daily as needed for muscle spasms 11/29/20  Yes   zonisamide  (ZONEGRAN) 100 MG capsule Take 200 mg by mouth at bedtime. 03/22/21  Yes [provider]  methylPREDNISolone (MEDROL) 4 MG TBPK tablet Take 6 tablets 1st day, 5 tablets 2nd day, 4 tablets 3rd day, 3 tablets 4th day, 2 tablets 5th day, 1 tablet the 6th day Patient not taking: Reported on 09/23/2021 06/25/21       ROS: All other systems have been reviewed and were otherwise negative with the exception of those mentioned in the HPI and as above.  Physical Exam: General: Alert, no acute distress Cardiovascular: No pedal edema Respiratory: No cyanosis, no use of accessory musculature GI: No organomegaly, abdomen is soft and non-tender Skin: No  lesions in the area of chief complaint Neurologic: Sensation intact distally Psychiatric: Patient is competent for consent with normal mood and affect Lymphatic: No axillary or cervical lymphadenopathy  MUSCULOSKELETAL:  The range of motion is to 90 actively, passive to 110. External rotation to 45 versus 90. Cuff strength is relatively preserved but difficult to test secondary to patient pain.  Imaging: Her EMG demonstrates no obvious findings.   MRI of the left shoulder reviewed in [facility], which demonstrates small partial thickness bursal surface tear of the supraspinatus tendon, some inflammation around the biceps tendon and signs consistent with adhesive capsulitis.    MRI of the C-spine demonstrates some mild C3-C4 left, C4-C5 bilateral neural foraminal narrowing.    Assessment: left shoulder cartilage disorder, adhesive capsulitis  Plan: Plan for Procedure(s): ARTHROSCOPY SHOULDER/DEBRIDMENT LYSIS OF ADHESION/MANIPULATION Langston Masker  The risks benefits and alternatives were discussed with the patient including but not limited to the risks of nonoperative treatment, versus surgical intervention including infection, bleeding, nerve injury,  blood clots, cardiopulmonary complications, morbidity, mortality, among others, and they were willing  to proceed.   The patient acknowledged the explanation, agreed to proceed with the plan and consent was signed.   Operative Plan: Left shoulder scope with manipulation under anesthesia and lysis of adhesions  Discharge Medications: standard DVT Prophylaxis: none Physical Therapy: outpatient PT Special Discharge needs: [place]. [place], PA-C  11/25/2021 4:57 PM

## 2021-11-27 NOTE — Discharge Instructions (Signed)
Ophelia Charter MD, MPH Noemi Chapel, PA-C Trainer [address] [phone] (tel)   [phone] (fax)   POST-OPERATIVE INSTRUCTIONS - SHOULDER ARTHROSCOPY  WOUND CARE You may remove the Operative Dressing on Post-Op Day #3 (72hrs after surgery).   Alternatively if you would like you can leave dressing on until follow-up if within 7-8 days but keep it dry. Leave steri-strips in place until they fall off on their own, usually 2 weeks postop. There may be a small amount of fluid/bleeding leaking at the surgical site.  This is normal; the shoulder is filled with fluid during the procedure and can leak for 24-48hrs after surgery.  You may change/reinforce the bandage as needed.  Use the Cryocuff or Ice as often as possible for the first 7 days, then as needed for pain relief. Always keep a towel, ACE wrap or other barrier between the cooling unit and your skin.  You may shower on Post-Op Day #3. Gently pat the area dry.  Do not soak the shoulder in water or submerge it.  Keep incisions as dry as possible. Do not go swimming in the pool or ocean until 4 weeks after surgery or when otherwise instructed.    EXERCISES Sling should be discontinued once your nerve block wears off  After that you should use it for comfort only but this is not required  It is normal for your fingers/hand to become more swollen after surgery and discolored from bruising.   This will resolve over the first few weeks usually after surgery. Please continue to ambulate and do not stay sitting or lying for too long.  Perform foot and wrist pumps to assist in circulation.  PHYSICAL THERAPY - You will begin physical therapy soon after surgery - Please schedule a physical therapy appointment ASAP if you have not already done so - Let our office if there are any issues with scheduling your therapy  - Physical therapy will be extremely important for your recovery  - A hard copy of  your PT prescription was provided to you today  REGIONAL ANESTHESIA (NERVE BLOCKS) The anesthesia team may have performed a nerve block for you this is a great tool used to minimize pain.   The block may start wearing off overnight (between 8-24 hours postop) When the block wears off, your pain may go from nearly zero to the pain you would have had postop without the block. This is an abrupt transition but nothing dangerous is happening.   This can be a challenging period but utilize your as needed pain medications to try and manage this period. We suggest you use the pain medication the first night prior to going to bed, to ease this transition.  You may take an extra dose of narcotic when this happens if needed  POST-OP MEDICATIONS- Multimodal approach to pain control In general your pain will be controlled with a combination of substances.  Prescriptions unless otherwise discussed are electronically sent to your pharmacy.  This is a carefully made plan we use to minimize narcotic use.     Diclofenac - Anti-inflammatory medication taken on a scheduled basis Acetaminophen - Non-narcotic pain medicine taken on a scheduled basis  Oxycodone - This is a strong narcotic, to be used only on an "as needed" basis for SEVERE pain.   Zofran - take as needed for nausea   FOLLOW-UP If you develop a Fever (?101.5), Redness or Drainage from the surgical incision site, please call our office  to arrange for an evaluation. Please call the office to schedule a follow-up appointment for your first post-operative appointment, 7-10 days post-operatively.    HELPFUL INFORMATION   You may be more comfortable sleeping in a semi-seated position the first few nights following surgery.  Keep a pillow propped under the elbow and forearm for comfort.  If you have a recliner type of chair it might be beneficial.  If not that is fine too, but it would be helpful to sleep propped up with pillows behind your operated  shoulder as well under your elbow and forearm.  This will reduce pulling on the suture lines.  When dressing, put your operative arm in the sleeve first.  When getting undressed, take your operative arm out last.  Loose fitting, button-down shirts are recommended.  Often in the first days after surgery you may be more comfortable keeping your operative arm under your shirt and not through the sleeve.  You may return to work/school in the next couple of days when you feel up to it.  Desk work and typing in the sling is fine.  We suggest you use the pain medication the first night prior to going to bed, in order to ease any pain when the anesthesia wears off. You should avoid taking pain medications on an empty stomach as it will make you nauseous.  You should wean off your narcotic medicines as soon as you are able.  Most patients will be off or using minimal narcotics before their first postop appointment.   Do not drink alcoholic beverages or take illicit drugs when taking pain medications.  It is against the law to drive while taking narcotics.  In some states it is against the law to drive while your arm is in a sling.   Pain medication may make you constipated.  Below are a few solutions to try in this order: Decrease the amount of pain medication if you aren't having pain. Drink lots of decaffeinated fluids. Drink prune juice and/or eat dried prunes  If the first 3 don't work start with additional solutions Take Colace - an over-the-counter stool softener Take Senokot - an over-the-counter laxative Take Miralax - a stronger over-the-counter laxative  For more information including helpful videos and documents visit our website:   [email or web address]   May take Tylenol after 1:45pm, if needed.    Post Anesthesia Home Care Instructions  Activity: Get plenty of rest for the remainder of the day. A responsible individual must stay with you for 24  hours following the procedure.  For the next 24 hours, DO NOT: -Drive a car -Paediatric nurse -Drink alcoholic beverages -Take any medication unless instructed by your physician -Make any legal decisions or sign important papers.  Meals: Start with liquid foods such as gelatin or soup. Progress to regular foods as tolerated. Avoid greasy, spicy, heavy foods. If nausea and/or vomiting occur, drink only clear liquids until the nausea and/or vomiting subsides. Call your physician if vomiting continues.  Special Instructions/Symptoms: Your throat may feel dry or sore from the anesthesia or the breathing tube placed in your throat during surgery. If this causes discomfort, gargle with warm salt water. The discomfort should disappear within 24 hours.  If you had a scopolamine patch placed behind your ear for the management of post- operative nausea and/or vomiting:  1. The medication in the patch is effective for 72 hours, after which it should be removed.  Wrap patch in a tissue and discard in the trash.  Wash hands thoroughly with soap and water. 2. You may remove the patch earlier than 72 hours if you experience unpleasant side effects which may include dry mouth, dizziness or visual disturbances. 3. Avoid touching the patch. Wash your hands with soap and water after contact with the patch.    Regional Anesthesia Blocks  1. Numbness or the inability to move the "blocked" extremity may last from 3-48 hours after placement. The length of time depends on the medication injected and your individual response to the medication. If the numbness is not going away after 48 hours, call your surgeon.  2. The extremity that is blocked will need to be protected until the numbness is gone and the  Strength has returned. Because you cannot feel it, you will need to take extra care to avoid injury. Because it may be weak, you may have difficulty moving it or using it. You may not know what position it is in  without looking at it while the block is in effect.  3. For blocks in the legs and feet, returning to weight bearing and walking needs to be done carefully. You will need to wait until the numbness is entirely gone and the strength has returned. You should be able to move your leg and foot normally before you try and bear weight or walk. You will need someone to be with you when you first try to ensure you do not fall and possibly risk injury.  4. Bruising and tenderness at the needle site are common side effects and will resolve in a few days.  5. Persistent numbness or new problems with movement should be communicated to the surgeon or the [place] [phone] [place] [phone]). Information for Discharge Teaching: EXPAREL (bupivacaine liposome injectable suspension)   Your surgeon or anesthesiologist gave you EXPAREL(bupivacaine) to help control your pain after surgery.  EXPAREL is a local anesthetic that provides pain relief by numbing the tissue around the surgical site. EXPAREL is designed to release pain medication over time and can control pain for up to 72 hours. Depending on how you respond to EXPAREL, you may require less pain medication during your recovery.  Possible side effects: Temporary loss of sensation or ability to move in the area where bupivacaine was injected. Nausea, vomiting, constipation Rarely, numbness and tingling in your mouth or lips, lightheadedness, or anxiety may occur. Call your doctor right away if you think you may be experiencing any of these sensations, or if you have other questions regarding possible side effects.  Follow all other discharge instructions given to you by your surgeon or nurse. Eat a healthy diet and drink plenty of water or other fluids.  If you return to the hospital for any reason within 96 hours following the administration of EXPAREL, it is important for health care providers to know that you  have received this anesthetic. A teal colored band has been placed on your arm with the date, time and amount of EXPAREL you have received in order to alert and inform your health care providers. Please leave this armband in place for the full 96 hours following administration, and then you may remove the band.

## 2021-11-28 ENCOUNTER — Encounter (HOSPITAL_BASED_OUTPATIENT_CLINIC_OR_DEPARTMENT_OTHER)
Admission: RE | Disposition: A | Discharge status: Home / Self Care | Payer: Self-pay | Source: Home / Self Care | Attending: Orthopaedic Surgery

## 2021-11-28 ENCOUNTER — Ambulatory Visit (HOSPITAL_BASED_OUTPATIENT_CLINIC_OR_DEPARTMENT_OTHER): Payer: Medicaid Other | Admitting: Certified Registered"

## 2021-11-28 ENCOUNTER — Other Ambulatory Visit: Payer: Self-pay

## 2021-11-28 ENCOUNTER — Ambulatory Visit (HOSPITAL_BASED_OUTPATIENT_CLINIC_OR_DEPARTMENT_OTHER)
Admission: RE | Admit: 2021-11-28 | Discharge: 2021-11-28 | Disposition: A | Discharge status: Home / Self Care | Payer: Medicaid Other | Attending: Orthopaedic Surgery | Admitting: Orthopaedic Surgery

## 2021-11-28 ENCOUNTER — Encounter (HOSPITAL_BASED_OUTPATIENT_CLINIC_OR_DEPARTMENT_OTHER): Payer: Self-pay | Admitting: Orthopaedic Surgery

## 2021-11-28 DIAGNOSIS — M7502 Adhesive capsulitis of left shoulder: Secondary | ICD-10-CM | POA: Diagnosis present

## 2021-11-28 DIAGNOSIS — M7522 Bicipital tendinitis, left shoulder: Secondary | ICD-10-CM | POA: Insufficient documentation

## 2021-11-28 DIAGNOSIS — E785 Hyperlipidemia, unspecified: Secondary | ICD-10-CM | POA: Diagnosis not present

## 2021-11-28 DIAGNOSIS — S43432A Superior glenoid labrum lesion of left shoulder, initial encounter: Secondary | ICD-10-CM | POA: Insufficient documentation

## 2021-11-28 DIAGNOSIS — Z8673 Personal history of transient ischemic attack (TIA), and cerebral infarction without residual deficits: Secondary | ICD-10-CM | POA: Diagnosis not present

## 2021-11-28 DIAGNOSIS — X58XXXA Exposure to other specified factors, initial encounter: Secondary | ICD-10-CM | POA: Diagnosis not present

## 2021-11-28 HISTORY — PX: LYSIS OF ADHESION: SHX5961

## 2021-11-28 HISTORY — PX: SHOULDER ARTHROSCOPY: SHX128

## 2021-11-28 LAB — POCT PREGNANCY, URINE: Preg Test, Ur: NEGATIVE

## 2021-11-28 SURGERY — ARTHROSCOPY, SHOULDER
Anesthesia: General | Site: Shoulder | Laterality: Left

## 2021-11-28 MED ORDER — ONDANSETRON HCL 4 MG/2ML IJ SOLN
INTRAMUSCULAR | Status: DC | PRN
  Administered 2021-11-28: 4 mg via INTRAVENOUS

## 2021-11-28 MED ORDER — CELECOXIB 200 MG PO CAPS
ORAL_CAPSULE | ORAL | Status: AC
  Filled 2021-11-28: qty 1

## 2021-11-28 MED ORDER — MIDAZOLAM HCL 2 MG/2ML IJ SOLN
INTRAMUSCULAR | Status: AC
  Filled 2021-11-28: qty 2

## 2021-11-28 MED ORDER — DEXAMETHASONE SODIUM PHOSPHATE 10 MG/ML IJ SOLN
INTRAMUSCULAR | Status: DC | PRN
  Administered 2021-11-28: 10 mg via INTRAVENOUS

## 2021-11-28 MED ORDER — ROCURONIUM BROMIDE 10 MG/ML (PF) SYRINGE
PREFILLED_SYRINGE | INTRAVENOUS | Status: AC
  Filled 2021-11-28: qty 10

## 2021-11-28 MED ORDER — GLYCOPYRROLATE 0.2 MG/ML IJ SOLN
INTRAMUSCULAR | Status: DC | PRN
  Administered 2021-11-28: .1 mg via INTRAVENOUS

## 2021-11-28 MED ORDER — LIDOCAINE 2% (20 MG/ML) 5 ML SYRINGE
INTRAMUSCULAR | Status: AC
  Filled 2021-11-28: qty 5

## 2021-11-28 MED ORDER — BUPIVACAINE-EPINEPHRINE (PF) 0.5% -1:200000 IJ SOLN
INTRAMUSCULAR | Status: DC | PRN
  Administered 2021-11-28: 20 mL via PERINEURAL

## 2021-11-28 MED ORDER — GABAPENTIN 300 MG PO CAPS
300.0000 mg | ORAL_CAPSULE | Freq: Once | ORAL | Status: AC
  Administered 2021-11-28: 300 mg via ORAL

## 2021-11-28 MED ORDER — DEXAMETHASONE SODIUM PHOSPHATE 10 MG/ML IJ SOLN
INTRAMUSCULAR | Status: AC
  Filled 2021-11-28: qty 1

## 2021-11-28 MED ORDER — FENTANYL CITRATE (PF) 100 MCG/2ML IJ SOLN
50.0000 ug | Freq: Once | INTRAMUSCULAR | Status: AC
  Administered 2021-11-28: 50 ug via INTRAVENOUS

## 2021-11-28 MED ORDER — ACETAMINOPHEN 500 MG PO TABS: 1000.0000 mg | ORAL_TABLET | Freq: Three times a day (TID) | ORAL | 0 refills | Status: AC

## 2021-11-28 MED ORDER — SUGAMMADEX SODIUM 500 MG/5ML IV SOLN
INTRAVENOUS | Status: DC | PRN
  Administered 2021-11-28: 500 mg via INTRAVENOUS

## 2021-11-28 MED ORDER — GABAPENTIN 300 MG PO CAPS
ORAL_CAPSULE | ORAL | Status: AC
  Filled 2021-11-28: qty 1

## 2021-11-28 MED ORDER — PROPOFOL 10 MG/ML IV BOLUS
INTRAVENOUS | Status: DC | PRN
  Administered 2021-11-28: 150 mg via INTRAVENOUS
  Administered 2021-11-28: 50 mg via INTRAVENOUS

## 2021-11-28 MED ORDER — OXYCODONE HCL 5 MG PO TABS: 5.0000 mg | ORAL_TABLET | Freq: Once | ORAL | Status: DC | PRN

## 2021-11-28 MED ORDER — FENTANYL CITRATE (PF) 100 MCG/2ML IJ SOLN
INTRAMUSCULAR | Status: AC
  Filled 2021-11-28: qty 2

## 2021-11-28 MED ORDER — BUPIVACAINE LIPOSOME 1.3 % IJ SUSP
INTRAMUSCULAR | Status: DC | PRN
  Administered 2021-11-28: 10 mL via PERINEURAL

## 2021-11-28 MED ORDER — EPHEDRINE 5 MG/ML INJ
INTRAVENOUS | Status: AC
  Filled 2021-11-28: qty 5

## 2021-11-28 MED ORDER — CEFAZOLIN SODIUM-DEXTROSE 2-4 GM/100ML-% IV SOLN
2.0000 g | INTRAVENOUS | Status: AC
  Administered 2021-11-28: 2 g via INTRAVENOUS

## 2021-11-28 MED ORDER — SCOPOLAMINE 1 MG/3DAYS TD PT72
1.0000 | MEDICATED_PATCH | TRANSDERMAL | Status: DC
  Administered 2021-11-28: 1.5 mg via TRANSDERMAL

## 2021-11-28 MED ORDER — ROCURONIUM BROMIDE 100 MG/10ML IV SOLN
INTRAVENOUS | Status: DC | PRN
  Administered 2021-11-28: 100 mg via INTRAVENOUS

## 2021-11-28 MED ORDER — ACETAMINOPHEN 500 MG PO TABS
1000.0000 mg | ORAL_TABLET | Freq: Once | ORAL | Status: AC
  Administered 2021-11-28: 1000 mg via ORAL

## 2021-11-28 MED ORDER — PHENYLEPHRINE HCL (PRESSORS) 10 MG/ML IV SOLN
INTRAVENOUS | Status: DC | PRN
  Administered 2021-11-28 (×2): 80 ug via INTRAVENOUS

## 2021-11-28 MED ORDER — MIDAZOLAM HCL 2 MG/2ML IJ SOLN
2.0000 mg | Freq: Once | INTRAMUSCULAR | Status: AC
  Administered 2021-11-28: 2 mg via INTRAVENOUS

## 2021-11-28 MED ORDER — ONDANSETRON HCL 4 MG/2ML IJ SOLN
INTRAMUSCULAR | Status: AC
  Filled 2021-11-28: qty 2

## 2021-11-28 MED ORDER — DICLOFENAC SODIUM 75 MG PO TBEC: 75.0000 mg | DELAYED_RELEASE_TABLET | Freq: Two times a day (BID) | ORAL | 0 refills | Status: AC

## 2021-11-28 MED ORDER — OXYCODONE HCL 5 MG/5ML PO SOLN: 5.0000 mg | Freq: Once | ORAL | Status: DC | PRN

## 2021-11-28 MED ORDER — OXYCODONE HCL 5 MG PO TABS: ORAL_TABLET | ORAL | 0 refills | Status: AC

## 2021-11-28 MED ORDER — EPHEDRINE SULFATE (PRESSORS) 50 MG/ML IJ SOLN
INTRAMUSCULAR | Status: DC | PRN
  Administered 2021-11-28: 10 mg via INTRAVENOUS
  Administered 2021-11-28 (×2): 5 mg via INTRAVENOUS

## 2021-11-28 MED ORDER — LACTATED RINGERS IV SOLN: INTRAVENOUS | Status: DC

## 2021-11-28 MED ORDER — ONDANSETRON HCL 4 MG/2ML IJ SOLN: 4.0000 mg | Freq: Once | INTRAMUSCULAR | Status: DC | PRN

## 2021-11-28 MED ORDER — SUGAMMADEX SODIUM 500 MG/5ML IV SOLN
INTRAVENOUS | Status: AC
  Filled 2021-11-28: qty 5

## 2021-11-28 MED ORDER — ACETAMINOPHEN 500 MG PO TABS
ORAL_TABLET | ORAL | Status: AC
  Filled 2021-11-28: qty 2

## 2021-11-28 MED ORDER — CEFAZOLIN SODIUM-DEXTROSE 2-4 GM/100ML-% IV SOLN
INTRAVENOUS | Status: AC
  Filled 2021-11-28: qty 100

## 2021-11-28 MED ORDER — PROPOFOL 10 MG/ML IV BOLUS
INTRAVENOUS | Status: AC
  Filled 2021-11-28: qty 20

## 2021-11-28 MED ORDER — FENTANYL CITRATE (PF) 100 MCG/2ML IJ SOLN: 25.0000 ug | INTRAMUSCULAR | Status: DC | PRN

## 2021-11-28 MED ORDER — ONDANSETRON HCL 4 MG PO TABS: 4.0000 mg | ORAL_TABLET | Freq: Three times a day (TID) | ORAL | 0 refills | Status: AC | PRN

## 2021-11-28 MED ORDER — SCOPOLAMINE 1 MG/3DAYS TD PT72
MEDICATED_PATCH | TRANSDERMAL | Status: AC
  Filled 2021-11-28: qty 1

## 2021-11-28 SURGICAL SUPPLY — 56 items
AID PSTN UNV HD RSTRNT DISP (MISCELLANEOUS) ×1
APL PRP STRL LF DISP 70% ISPRP (MISCELLANEOUS) ×1
BLADE EXCALIBUR 4.0X13 (MISCELLANEOUS) ×2 IMPLANT
BLADE SURG 10 STRL SS (BLADE) IMPLANT
BURR OVAL 8 FLU 4.0X13 (MISCELLANEOUS) IMPLANT
CANNULA 5.75X71 LONG (CANNULA) IMPLANT
CANNULA PASSPORT 5 (CANNULA) IMPLANT
CANNULA PASSPORT BUTTON 10-40 (CANNULA) IMPLANT
CANNULA TWIST IN 8.25X7CM (CANNULA) IMPLANT
CHLORAPREP W/TINT 26 (MISCELLANEOUS) ×2 IMPLANT
CLSR STERI-STRIP ANTIMIC 1/2X4 (GAUZE/BANDAGES/DRESSINGS) ×2 IMPLANT
COOLER ICEMAN CLASSIC (MISCELLANEOUS) ×2 IMPLANT
DRAPE IMP U-DRAPE 54X76 (DRAPES) ×2 IMPLANT
DRAPE INCISE IOBAN 66X45 STRL (DRAPES) IMPLANT
DRAPE SHOULDER BEACH CHAIR (DRAPES) ×2 IMPLANT
DRSG PAD ABDOMINAL 8X10 ST (GAUZE/BANDAGES/DRESSINGS) ×2 IMPLANT
DW OUTFLOW CASSETTE/TUBE SET (MISCELLANEOUS) ×1 IMPLANT
GAUZE SPONGE 4X4 12PLY STRL (GAUZE/BANDAGES/DRESSINGS) ×2 IMPLANT
GLOVE BIO SURGEON STRL SZ 6.5 (GLOVE) ×2 IMPLANT
GLOVE BIOGEL PI IND STRL 6.5 (GLOVE) ×1 IMPLANT
GLOVE BIOGEL PI IND STRL 8 (GLOVE) ×1 IMPLANT
GLOVE BIOGEL PI INDICATOR 6.5 (GLOVE) ×1
GLOVE BIOGEL PI INDICATOR 8 (GLOVE) ×1
GLOVE ECLIPSE 8.0 STRL XLNG CF (GLOVE) ×2 IMPLANT
GOWN STRL REUS W/ TWL LRG LVL3 (GOWN DISPOSABLE) ×2 IMPLANT
GOWN STRL REUS W/TWL LRG LVL3 (GOWN DISPOSABLE) ×4
GOWN STRL REUS W/TWL XL LVL3 (GOWN DISPOSABLE) ×2 IMPLANT
KIT SHOULDER STAB MARCO (KITS) ×2 IMPLANT
KIT STR SPEAR 1.8 FBRTK DISP (KITS) IMPLANT
LASSO CRESCENT QUICKPASS (SUTURE) IMPLANT
MANIFOLD NEPTUNE II (INSTRUMENTS) ×2 IMPLANT
NDL SAFETY ECLIPSE 18X1.5 (NEEDLE) ×1 IMPLANT
NDL SCORPION MULTI FIRE (NEEDLE) IMPLANT
NEEDLE HYPO 18GX1.5 SHARP (NEEDLE) ×2
NEEDLE SCORPION MULTI FIRE (NEEDLE) IMPLANT
PACK ARTHROSCOPY DSU (CUSTOM PROCEDURE TRAY) ×2 IMPLANT
PACK BASIN DAY SURGERY FS (CUSTOM PROCEDURE TRAY) ×2 IMPLANT
PAD COLD SHLDR WRAP-ON (PAD) ×2 IMPLANT
PORT APPOLLO RF 90DEGREE MULTI (SURGICAL WAND) ×2 IMPLANT
RESTRAINT HEAD UNIVERSAL NS (MISCELLANEOUS) ×2 IMPLANT
SHEET MEDIUM DRAPE 40X70 STRL (DRAPES) IMPLANT
SLEEVE SCD COMPRESS KNEE MED (STOCKING) ×2 IMPLANT
SLING ARM FOAM STRAP LRG (SOFTGOODS) ×1 IMPLANT
SUT FIBERWIRE #2 38 T-5 BLUE (SUTURE)
SUT MNCRL AB 4-0 PS2 18 (SUTURE) ×2 IMPLANT
SUT PDS AB 1 CT  36 (SUTURE) ×2
SUT PDS AB 1 CT 36 (SUTURE) IMPLANT
SUT TIGER TAPE 7 IN WHITE (SUTURE) IMPLANT
SUTURE FIBERWR #2 38 T-5 BLUE (SUTURE) IMPLANT
SUTURE TAPE TIGERLINK 1.3MM BL (SUTURE) IMPLANT
SUTURETAPE TIGERLINK 1.3MM BL (SUTURE)
SYR 5ML LL (SYRINGE) ×2 IMPLANT
TAPE FIBER 2MM 7IN #2 BLUE (SUTURE) IMPLANT
TOWEL GREEN STERILE FF (TOWEL DISPOSABLE) ×4 IMPLANT
TUBE CONNECTING 20X1/4 (TUBING) ×2 IMPLANT
TUBING ARTHROSCOPY IRRIG 16FT (MISCELLANEOUS) ×2 IMPLANT

## 2021-11-28 NOTE — Anesthesia Procedure Notes (Signed)
Procedure Name: Intubation Date/Time: 11/28/2021 9:02 AM  Performed by: Lavonia Dana, CRNAPre-anesthesia Checklist: Patient identified, Emergency Drugs available, Suction available and Patient being monitored Patient Re-evaluated:Patient Re-evaluated prior to induction Oxygen Delivery Method: Circle system utilized Preoxygenation: Pre-oxygenation with 100% oxygen Induction Type: IV induction Ventilation: Mask ventilation without difficulty Laryngoscope Size: Mac and 3 Grade View: Grade I Tube type: Oral Tube size: 7.0 mm Number of attempts: 1 Airway Equipment and Method: Stylet and Bite block Placement Confirmation: ETT inserted through vocal cords under direct vision, positive ETCO2 and breath sounds checked- equal and bilateral Secured at: 22 cm Tube secured with: Tape Dental Injury: Teeth and Oropharynx as per pre-operative assessment

## 2021-11-28 NOTE — Anesthesia Procedure Notes (Signed)
Anesthesia Regional Block: Interscalene brachial plexus block   Pre-Anesthetic Checklist: , timeout performed,  Correct Patient, Correct Site, Correct Laterality,  Correct Procedure, Correct Position, site marked,  Risks and benefits discussed,  Surgical consent,  Pre-op evaluation,  At surgeon's request and post-op pain management  Laterality: Left  Prep: chloraprep       Needles:  Injection technique: Single-shot  Needle Type: Echogenic Stimulator Needle     Needle Length: 10cm  Needle Gauge: 21   Needle insertion depth: 6 cm   Additional Needles:   Procedures:,,,, ultrasound used (permanent image in chart),,    Narrative:  Start time: 11/28/2021 8:17 AM End time: 11/28/2021 8:22 AM Injection made incrementally with aspirations every 5 mL.  Performed by: Personally  Anesthesiologist: Josephine Igo, MD  Additional Notes: Timeout performed. Patient sedated. Relevant anatomy ID'd using [place]. Incremental 2-5m injection of LA with frequent aspiration. Patient tolerated procedure well.     Left Interscalene Block

## 2021-11-28 NOTE — Transfer of Care (Signed)
Immediate Anesthesia Transfer of Care Note  Patient: Christina Goodman  Procedure(s) Performed: ARTHROSCOPY SHOULDER/DEBRIDMENT (Left: Shoulder) LYSIS OF ADHESION/MANIPULATION Langston Masker (Left: Shoulder)  Patient Location: PACU  Anesthesia Type:GA combined with regional for post-op pain  Level of Consciousness: drowsy  Airway & Oxygen Therapy: Patient Spontanous Breathing and Patient connected to face mask oxygen  Post-op Assessment: Report given to RN and Post -op Vital signs reviewed and stable  Post vital signs: Reviewed and stable  Last Vitals:  Vitals Value Taken Time  BP 118/72 11/28/21 0955  Temp    Pulse 73 11/28/21 0956  Resp 14 11/28/21 0956  SpO2 98 % 11/28/21 0956  Vitals shown include unvalidated device data.  Last Pain:  Vitals:   11/28/21 0738  TempSrc: Oral  PainSc: 0-No pain         Complications: No notable events documented.

## 2021-11-28 NOTE — Op Note (Signed)
Orthopaedic Surgery Operative Note (CSN: [id number])  Christina Goodman  1971/02/20 Date of Surgery: 11/28/2021   DIAGNOSES: Left shoulder, SLAP tear, biceps tendinitis, and adhesive capsulitis .  POST-OPERATIVE DIAGNOSIS: same  PROCEDURE: Arthroscopic extensive debridement - 29823 Subdeltoid Bursa, Anterior Labrum, and rotator interval Arthroscopic biceps tenodesis - 53664 Capsular release with lysis of adhesions   OPERATIVE FINDING: Exam under anesthesia:  Patient had 120 of forward flexion X rotation to 30 to start, at the end of the case she had 180 of forward flexion and external rotation to 80 degrees. Articular space: Normal, significant capsulitis Chondral surfaces: Normal Biceps:  Type II SLAP tear and a Buford complex, there is significant redness in the biceps.  We worried that the patient would have continued pain and thus performed a Pitt style tenodesis. Subscapularis: Intact  Supraspinatus: Intact  Infraspinatus: Intact      Post-operative plan: The patient will be non-weightbearing in a sling until her nerve block wears off with early therapy.  The patient will be discharged home.  DVT prophylaxis not indicated in ambulatory upper extremity patient without known risk factors.   Pain control with PRN pain medication preferring oral medicines.  Follow up plan will be scheduled in approximately 7 days for incision check.  Surgeons:Primary: Hiram Gash, MD Assistants:Caroline McBane PA-C Location: [facility] OR ROOM 1 Anesthesia: General with Exparel interscalene block Antibiotics: Ancef 2 g Tourniquet time: None Estimated Blood Loss: Minimal Complications: None Specimens: None Implants: * No implants in log *  Indications for Surgery:   Christina Goodman is a 51 y.o. female with continued shoulder pain refractory to nonoperative measures for extended period of time.    The risks and benefits were explained at length including but not limited to continued pain, cuff  failure, biceps tenodesis failure, stiffness, need for further surgery and infection.   Procedure:   Patient was correctly identified in the preoperative holding area and operative site marked.  Patient brought to OR and positioned beachchair on an Dyess table ensuring that all bony prominences were padded and the head was in an appropriate location.  Anesthesia was induced and the operative shoulder was prepped and draped in the usual sterile fashion.  Timeout was called preincision.  A standard posterior viewing portal was made after localizing the portal with a spinal needle.  An anterior accessory portal was also made.  After clearing the articular space the camera was positioned in the subacromial space.  Findings above.    Extensive debridement was performed of the anterior interval tissue, labral fraying and the bursa.  We identified the thickened MGHL and released this with a RF ablator.  We then used the RF ablator to skeletonize the anterior interval.  Once was complete we used a combination of the RF ablator as well as the basket device to complete a capsular release along the inferior half of the glenoid just off the glenoid labrum.  We stayed close to the labrum to avoid neurovascular damage.  At that point we removed all instruments and were able to manipulate the shoulder and demonstrate that there was improvement in range of motion as documented above.  We placed the scope back into the shoulder to ensure that there were no damage to the cuff or other structures in the shoulder.  We identified a SLAP tear.  We performed a pit style tenodesis.  We used a spinal needle to pass a PDS and then shuttled a FiberWire suture through the biceps as it was  passing by the subscapularis and did so again in the upper border of the subscapularis.  We then were able to shuttle our FiberWire through these 2 structures and tied alternating half hitches with arthroscopic knot tying techniques to firmly  affixed the biceps to the subscapularis.  We then cut the biceps at the top of the shoulder ensuring that her tension was appropriate.  We debrided any bulbous end of the biceps tendon.  The incisions were closed with absorbable monocryl and steri strips.  A sterile dressing was placed along with a sling. The patient was awoken from general anesthesia and taken to the PACU in stable condition without complication.   Christina Chapel, PA-C, present and scrubbed throughout the case, critical for completion in a timely fashion, and for retraction, instrumentation, closure.

## 2021-11-28 NOTE — Anesthesia Postprocedure Evaluation (Signed)
Anesthesia Post Note  Patient: Christina Goodman  Procedure(s) Performed: ARTHROSCOPY SHOULDER/DEBRIDMENT (Left: Shoulder) LYSIS OF ADHESION/MANIPULATION Langston Masker (Left: Shoulder)     Patient location during evaluation: PACU Anesthesia Type: General Level of consciousness: awake and alert and oriented Pain management: pain level controlled Vital Signs Assessment: post-procedure vital signs reviewed and stable Respiratory status: spontaneous breathing, nonlabored ventilation and respiratory function stable Cardiovascular status: blood pressure returned to baseline and stable Postop Assessment: no apparent nausea or vomiting Anesthetic complications: no   No notable events documented.  Last Vitals:  Vitals:   11/28/21 1000 11/28/21 1013  BP: 124/64 122/74  Pulse: 74 (!) 58  Resp: 16 14  Temp:    SpO2: 98% 96%    Last Pain:  Vitals:   11/28/21 1013  TempSrc:   PainSc: 0-No pain                 Christina Goodman A.

## 2021-11-28 NOTE — Interval H&P Note (Signed)
All questions answered, patient wants to proceed with procedure. ? ?

## 2021-11-28 NOTE — Anesthesia Preprocedure Evaluation (Addendum)
Anesthesia Evaluation  Patient identified by MRN, date of birth, ID band Patient awake    Reviewed: Allergy & Precautions, NPO status , Patient's Chart, lab work & pertinent test results  History of Anesthesia Complications (+) PONV and history of anesthetic complications  Airway Mallampati: I  TM Distance: >3 FB Neck ROM: Full    Dental no notable dental hx. (+) Teeth Intact, Dental Advisory Given   Pulmonary neg pulmonary ROS,    Pulmonary exam normal breath sounds clear to auscultation       Cardiovascular Normal cardiovascular exam Rhythm:Regular Rate:Normal  Hx/o PFO s/p closure 2019  Echo 05/27/19 1. Left ventricular ejection fraction, by visual estimation, is 60 to 65%. The left ventricle has normal function. There is no increased left ventricular wall thickness.  2. Left ventricular diastolic parameters are consistent with Grade I diastolic dysfunction (impaired relaxation).  3. Global right ventricle has normal systolc function.The right ventricular size is normal. no increase in right ventricular wall thickness.  4. 18 mm Amplatzer PFO device. No interatrial shunting.  5. The mitral valve is normal in structure. No evidence of mitral valve regurgitation. No evidence of mitral stenosis.  6. The tricuspid valve was normal in structure. Tricuspid valve regurgitation is not demonstrated.  7. Tricuspid valve regurgitation is not demonstrated.  8. No evidence of aortic valve sclerosis or stenosis.  9. The pulmonic valve was normal in structure.  10. The inferior vena cava is normal in size with greater than 50% respiratory variability, suggesting right atrial pressure of 3 mmHg.   EKG 09/23/21 NSR with sinus arrhythmia, possible LAE   Neuro/Psych  Headaches, CVA, No Residual Symptoms negative psych ROS   GI/Hepatic negative GI ROS, Neg liver ROS,   Endo/Other  negative endocrine ROS  Renal/GU Renal  InsufficiencyRenal disease  negative genitourinary   Musculoskeletal Adhesive capsulitis left shoulder   Abdominal   Peds  Hematology negative hematology ROS (+)   Anesthesia Other Findings   Reproductive/Obstetrics                             Anesthesia Physical Anesthesia Plan  ASA: 2  Anesthesia Plan: General   Post-op Pain Management: Minimal or no pain anticipated and Regional block*   Induction: Intravenous  PONV Risk Score and Plan: 4 or greater and Treatment may vary due to age or medical condition, Midazolam, Ondansetron and Dexamethasone  Airway Management Planned: LMA  Additional Equipment: None  Intra-op Plan:   Post-operative Plan: Extubation in OR  Informed Consent: I have reviewed the patients History and Physical, chart, labs and discussed the procedure including the risks, benefits and alternatives for the proposed anesthesia with the patient or authorized representative who has indicated his/her understanding and acceptance.     Dental advisory given  Plan Discussed with: Anesthesiologist and CRNA  Anesthesia Plan Comments:         Anesthesia Quick Evaluation

## 2021-11-28 NOTE — Progress Notes (Signed)
Assisted Dr. Foster with left, interscalene , ultrasound guided block. Side rails up, monitors on throughout procedure. See vital signs in flow sheet. Tolerated Procedure well. 

## 2021-11-29 ENCOUNTER — Encounter (HOSPITAL_BASED_OUTPATIENT_CLINIC_OR_DEPARTMENT_OTHER): Payer: Self-pay | Admitting: Orthopaedic Surgery

## 2022-01-15 ENCOUNTER — Other Ambulatory Visit (HOSPITAL_COMMUNITY): Payer: Self-pay

## 2022-08-15 DIAGNOSIS — K219 Gastro-esophageal reflux disease without esophagitis: Secondary | ICD-10-CM | POA: Diagnosis not present

## 2022-08-15 DIAGNOSIS — E782 Mixed hyperlipidemia: Secondary | ICD-10-CM | POA: Diagnosis not present

## 2022-08-15 DIAGNOSIS — R252 Cramp and spasm: Secondary | ICD-10-CM | POA: Diagnosis not present

## 2022-08-15 DIAGNOSIS — G43009 Migraine without aura, not intractable, without status migrainosus: Secondary | ICD-10-CM | POA: Diagnosis not present

## 2022-08-15 DIAGNOSIS — L659 Nonscarring hair loss, unspecified: Secondary | ICD-10-CM | POA: Diagnosis not present

## 2022-08-15 DIAGNOSIS — Z6828 Body mass index (BMI) 28.0-28.9, adult: Secondary | ICD-10-CM | POA: Diagnosis not present

## 2022-08-15 DIAGNOSIS — G479 Sleep disorder, unspecified: Secondary | ICD-10-CM | POA: Diagnosis not present

## 2022-08-15 DIAGNOSIS — F322 Major depressive disorder, single episode, severe without psychotic features: Secondary | ICD-10-CM | POA: Diagnosis not present

## 2022-08-28 DIAGNOSIS — D72829 Elevated white blood cell count, unspecified: Secondary | ICD-10-CM | POA: Diagnosis not present

## 2022-09-10 DIAGNOSIS — G43809 Other migraine, not intractable, without status migrainosus: Secondary | ICD-10-CM | POA: Diagnosis not present

## 2022-09-25 DIAGNOSIS — F1190 Opioid use, unspecified, uncomplicated: Secondary | ICD-10-CM | POA: Diagnosis not present

## 2022-09-25 DIAGNOSIS — F411 Generalized anxiety disorder: Secondary | ICD-10-CM | POA: Diagnosis not present

## 2022-09-25 DIAGNOSIS — F332 Major depressive disorder, recurrent severe without psychotic features: Secondary | ICD-10-CM | POA: Diagnosis not present

## 2022-09-25 DIAGNOSIS — F4312 Post-traumatic stress disorder, chronic: Secondary | ICD-10-CM | POA: Diagnosis not present

## 2022-09-25 DIAGNOSIS — Z79899 Other long term (current) drug therapy: Secondary | ICD-10-CM | POA: Diagnosis not present

## 2022-11-03 DIAGNOSIS — H524 Presbyopia: Secondary | ICD-10-CM | POA: Diagnosis not present

## 2022-11-11 DIAGNOSIS — F411 Generalized anxiety disorder: Secondary | ICD-10-CM | POA: Diagnosis not present

## 2022-11-11 DIAGNOSIS — F332 Major depressive disorder, recurrent severe without psychotic features: Secondary | ICD-10-CM | POA: Diagnosis not present

## 2022-11-11 DIAGNOSIS — F4312 Post-traumatic stress disorder, chronic: Secondary | ICD-10-CM | POA: Diagnosis not present

## 2022-11-13 DIAGNOSIS — G43809 Other migraine, not intractable, without status migrainosus: Secondary | ICD-10-CM | POA: Diagnosis not present

## 2022-12-09 DIAGNOSIS — F4312 Post-traumatic stress disorder, chronic: Secondary | ICD-10-CM | POA: Diagnosis not present

## 2022-12-09 DIAGNOSIS — F332 Major depressive disorder, recurrent severe without psychotic features: Secondary | ICD-10-CM | POA: Diagnosis not present

## 2022-12-09 DIAGNOSIS — F411 Generalized anxiety disorder: Secondary | ICD-10-CM | POA: Diagnosis not present

## 2023-02-09 DIAGNOSIS — F332 Major depressive disorder, recurrent severe without psychotic features: Secondary | ICD-10-CM | POA: Diagnosis not present

## 2023-02-09 DIAGNOSIS — F4312 Post-traumatic stress disorder, chronic: Secondary | ICD-10-CM | POA: Diagnosis not present

## 2023-02-09 DIAGNOSIS — F411 Generalized anxiety disorder: Secondary | ICD-10-CM | POA: Diagnosis not present

## 2023-04-01 DIAGNOSIS — F900 Attention-deficit hyperactivity disorder, predominantly inattentive type: Secondary | ICD-10-CM | POA: Diagnosis not present

## 2023-04-01 DIAGNOSIS — F332 Major depressive disorder, recurrent severe without psychotic features: Secondary | ICD-10-CM | POA: Diagnosis not present

## 2023-04-01 DIAGNOSIS — F411 Generalized anxiety disorder: Secondary | ICD-10-CM | POA: Diagnosis not present

## 2023-04-01 DIAGNOSIS — F4312 Post-traumatic stress disorder, chronic: Secondary | ICD-10-CM | POA: Diagnosis not present

## 2023-04-13 DIAGNOSIS — R051 Acute cough: Secondary | ICD-10-CM | POA: Diagnosis not present

## 2023-04-13 DIAGNOSIS — J101 Influenza due to other identified influenza virus with other respiratory manifestations: Secondary | ICD-10-CM | POA: Diagnosis not present

## 2023-04-13 DIAGNOSIS — Z6829 Body mass index (BMI) 29.0-29.9, adult: Secondary | ICD-10-CM | POA: Diagnosis not present

## 2023-04-13 DIAGNOSIS — J069 Acute upper respiratory infection, unspecified: Secondary | ICD-10-CM | POA: Diagnosis not present

## 2023-04-13 DIAGNOSIS — Z03818 Encounter for observation for suspected exposure to other biological agents ruled out: Secondary | ICD-10-CM | POA: Diagnosis not present

## 2023-05-05 DIAGNOSIS — J069 Acute upper respiratory infection, unspecified: Secondary | ICD-10-CM | POA: Diagnosis not present

## 2023-05-05 DIAGNOSIS — F322 Major depressive disorder, single episode, severe without psychotic features: Secondary | ICD-10-CM | POA: Diagnosis not present

## 2023-05-05 DIAGNOSIS — J101 Influenza due to other identified influenza virus with other respiratory manifestations: Secondary | ICD-10-CM | POA: Diagnosis not present

## 2023-05-05 DIAGNOSIS — Z6829 Body mass index (BMI) 29.0-29.9, adult: Secondary | ICD-10-CM | POA: Diagnosis not present

## 2023-05-07 DIAGNOSIS — F332 Major depressive disorder, recurrent severe without psychotic features: Secondary | ICD-10-CM | POA: Diagnosis not present

## 2023-05-07 DIAGNOSIS — F900 Attention-deficit hyperactivity disorder, predominantly inattentive type: Secondary | ICD-10-CM | POA: Diagnosis not present

## 2023-05-07 DIAGNOSIS — F4312 Post-traumatic stress disorder, chronic: Secondary | ICD-10-CM | POA: Diagnosis not present

## 2023-05-07 DIAGNOSIS — F411 Generalized anxiety disorder: Secondary | ICD-10-CM | POA: Diagnosis not present

## 2023-06-05 ENCOUNTER — Emergency Department (HOSPITAL_BASED_OUTPATIENT_CLINIC_OR_DEPARTMENT_OTHER)
Admission: EM | Admit: 2023-06-05 | Discharge: 2023-06-05 | Disposition: A | Discharge status: Home / Self Care | Payer: Medicare HMO | Attending: Emergency Medicine | Admitting: Emergency Medicine

## 2023-06-05 ENCOUNTER — Emergency Department (HOSPITAL_BASED_OUTPATIENT_CLINIC_OR_DEPARTMENT_OTHER): Payer: Medicare HMO

## 2023-06-05 ENCOUNTER — Encounter (HOSPITAL_BASED_OUTPATIENT_CLINIC_OR_DEPARTMENT_OTHER): Payer: Self-pay

## 2023-06-05 DIAGNOSIS — N3001 Acute cystitis with hematuria: Secondary | ICD-10-CM | POA: Insufficient documentation

## 2023-06-05 DIAGNOSIS — R109 Unspecified abdominal pain: Secondary | ICD-10-CM

## 2023-06-05 DIAGNOSIS — Z7982 Long term (current) use of aspirin: Secondary | ICD-10-CM | POA: Diagnosis not present

## 2023-06-05 DIAGNOSIS — Z8673 Personal history of transient ischemic attack (TIA), and cerebral infarction without residual deficits: Secondary | ICD-10-CM | POA: Insufficient documentation

## 2023-06-05 DIAGNOSIS — N132 Hydronephrosis with renal and ureteral calculous obstruction: Secondary | ICD-10-CM | POA: Diagnosis not present

## 2023-06-05 DIAGNOSIS — N200 Calculus of kidney: Secondary | ICD-10-CM | POA: Diagnosis not present

## 2023-06-05 LAB — BASIC METABOLIC PANEL
Anion gap: 10 (ref 5–15)
BUN: 12 mg/dL (ref 6–20)
CO2: 21 mmol/L — ABNORMAL LOW (ref 22–32)
Calcium: 9.4 mg/dL (ref 8.9–10.3)
Chloride: 106 mmol/L (ref 98–111)
Creatinine, Ser: 1.22 mg/dL — ABNORMAL HIGH (ref 0.44–1.00)
GFR, Estimated: 54 mL/min — ABNORMAL LOW (ref >60)
Glucose, Bld: 156 mg/dL — ABNORMAL HIGH (ref 70–99)
Potassium: 4 mmol/L (ref 3.5–5.1)
Sodium: 137 mmol/L (ref 135–145)

## 2023-06-05 LAB — URINALYSIS, ROUTINE W REFLEX MICROSCOPIC
Bilirubin Urine: NEGATIVE
Glucose, UA: NEGATIVE mg/dL
Ketones, ur: NEGATIVE mg/dL
Nitrite: NEGATIVE
RBC / HPF: >50 RBC/hpf (ref 0–5)
Specific Gravity, Urine: 1.029 (ref 1.005–1.030)
pH: 5.5 (ref 5.0–8.0)

## 2023-06-05 LAB — CBC
HCT: 36.5 % (ref 36.0–46.0)
Hemoglobin: 11.2 g/dL — ABNORMAL LOW (ref 12.0–15.0)
MCH: 25.1 pg — ABNORMAL LOW (ref 26.0–34.0)
MCHC: 30.7 g/dL (ref 30.0–36.0)
MCV: 81.8 fL (ref 80.0–100.0)
Platelets: 341 10*3/uL (ref 150–400)
RBC: 4.46 MIL/uL (ref 3.87–5.11)
RDW: 16.1 % — ABNORMAL HIGH (ref 11.5–15.5)
WBC: 12.4 10*3/uL — ABNORMAL HIGH (ref 4.0–10.5)
nRBC: 0 % (ref 0.0–0.2)

## 2023-06-05 LAB — HEPATIC FUNCTION PANEL
ALT: 11 U/L (ref 0–44)
AST: 13 U/L — ABNORMAL LOW (ref 15–41)
Albumin: 4.6 g/dL (ref 3.5–5.0)
Alkaline Phosphatase: 70 U/L (ref 38–126)
Bilirubin, Direct: 0.1 mg/dL (ref 0.0–0.2)
Indirect Bilirubin: 0.3 mg/dL (ref 0.3–0.9)
Total Bilirubin: 0.4 mg/dL (ref 0.0–1.2)
Total Protein: 7.7 g/dL (ref 6.5–8.1)

## 2023-06-05 MED ORDER — ONDANSETRON HCL 4 MG/2ML IJ SOLN
4.0000 mg | Freq: Once | INTRAMUSCULAR | Status: AC
  Administered 2023-06-05: 4 mg via INTRAVENOUS
  Filled 2023-06-05: qty 2

## 2023-06-05 MED ORDER — ONDANSETRON 4 MG PO TBDP: 4.0000 mg | ORAL_TABLET | Freq: Three times a day (TID) | ORAL | 0 refills | Status: AC | PRN

## 2023-06-05 MED ORDER — OXYCODONE-ACETAMINOPHEN 5-325 MG PO TABS: 1.0000 | ORAL_TABLET | Freq: Four times a day (QID) | ORAL | 0 refills | Status: AC | PRN

## 2023-06-05 MED ORDER — CEFDINIR 300 MG PO CAPS: 300.0000 mg | ORAL_CAPSULE | Freq: Two times a day (BID) | ORAL | 0 refills | Status: AC

## 2023-06-05 MED ORDER — SODIUM CHLORIDE 0.9 % IV BOLUS
1000.0000 mL | Freq: Once | INTRAVENOUS | Status: AC
  Administered 2023-06-05: 1000 mL via INTRAVENOUS

## 2023-06-05 MED ORDER — SODIUM CHLORIDE 0.9 % IV SOLN
1.0000 g | Freq: Once | INTRAVENOUS | Status: AC
  Administered 2023-06-05: 1 g via INTRAVENOUS
  Filled 2023-06-05: qty 10

## 2023-06-05 MED ORDER — TAMSULOSIN HCL 0.4 MG PO CAPS: 0.4000 mg | ORAL_CAPSULE | Freq: Every day | ORAL | 0 refills | Status: AC

## 2023-06-05 MED ORDER — ONDANSETRON HCL 4 MG/2ML IJ SOLN
4.0000 mg | Freq: Once | INTRAMUSCULAR | Status: AC
Start: 2023-06-05 — End: 2023-06-05
  Administered 2023-06-05: 4 mg via INTRAVENOUS
  Filled 2023-06-05: qty 2

## 2023-06-05 MED ORDER — MORPHINE SULFATE (PF) 4 MG/ML IV SOLN
4.0000 mg | Freq: Once | INTRAVENOUS | Status: AC
  Administered 2023-06-05: 4 mg via INTRAVENOUS
  Filled 2023-06-05: qty 1

## 2023-06-05 MED ORDER — KETOROLAC TROMETHAMINE 15 MG/ML IJ SOLN
15.0000 mg | Freq: Once | INTRAMUSCULAR | Status: AC
  Administered 2023-06-05: 15 mg via INTRAVENOUS
  Filled 2023-06-05: qty 1

## 2023-06-05 NOTE — ED Provider Notes (Signed)
[place] [facility] AT [facility] Provider Note   CSN: [id number] Arrival date & time: 06/05/23  1512     History  Chief Complaint  Patient presents with   Abdominal Pain   Flank Pain    R    Christina Goodman is a 53 y.o. female.  Patient with history of CVA, PFO, kidney stones presents today with complaints of right flank and abdominal pain.  She states that same began earlier today and has been persistent since then.  She also notes associated urinary frequency and dysuria.  Also reports nausea and vomiting.  Denies diarrhea.  Endorses history of C-section, no other history of abdominal surgeries.  No fevers or chills.  She states this does feel somewhat different from her previous kidney stones and that the pain is more constant and she is having more urinary frequency than normal.  Denies vaginal discharge.  The history is provided by the patient. No language interpreter was used.  Abdominal Pain Flank Pain Associated symptoms include abdominal pain.       Home Medications Prior to Admission medications   Medication Sig Start Date End Date Taking? Authorizing Provider  aspirin 81 MG tablet Take 81 mg by mouth daily. Take 2 low dose asprin daily    [provider]  cetirizine (ZYRTEC ALLERGY) 10 MG tablet Take 1 tablet (10 mg total) by mouth daily. 09/25/21   Cecil Cobbs, PA-C  Coenzyme Q10 (CO Q 10 PO) Take 1 tablet by mouth daily.    [provider]  diclofenac (VOLTAREN) 75 MG EC tablet Take 1 tablet (75 mg total) by mouth 2 (two) times daily. 11/28/21   McBane, Jerald Kief, PA-C  escitalopram (LEXAPRO) 5 MG tablet Take 5 mg by mouth daily. 02/28/21   [provider]  famotidine (PEPCID) 20 MG tablet Take 20 mg by mouth at bedtime. 02/28/21   [provider]  gabapentin (NEURONTIN) 300 MG capsule Take 1 capsule by mouth in the morning, at noon, and at bedtime. 11/26/20   [provider]  MAGNESIUM PO Take  500 mg by mouth daily.     [provider]  promethazine (PHENERGAN) 25 MG suppository 1 suppository 08/07/21   [provider]  propranolol ER (INDERAL LA) 60 MG 24 hr capsule Take 1 capsule (60 mg total) by mouth daily. 03/27/21   Swinyer, Zachary George, NP  QULIPTA 10 MG TABS Take 1 tablet by mouth daily. 03/15/21   [provider]  rosuvastatin (CRESTOR) 10 MG tablet Take 1 tablet (10 mg total) by mouth daily. 03/27/21   Swinyer, Zachary George, NP  tiZANidine (ZANAFLEX) 4 MG tablet Take 1 tablet by mouth 3 times daily as needed for muscle spasms 11/29/20     zonisamide (ZONEGRAN) 100 MG capsule Take 200 mg by mouth at bedtime. 03/22/21   [provider]      Allergies    Ivp dye [iodinated contrast media], Penicillins, Sulfa antibiotics, and Tape    Review of Systems   Review of Systems  Gastrointestinal:  Positive for abdominal pain.  Genitourinary:  Positive for flank pain.  All other systems reviewed and are negative.   Physical Exam Updated Vital Signs BP 116/74   Pulse 82   Temp 98.2 F (36.8 C)   Resp 20   SpO2 100%  Physical Exam Vitals and nursing note reviewed.  Constitutional:      General: She is not in acute distress.    Appearance: Normal appearance.  She is normal weight. She is not ill-appearing, toxic-appearing or diaphoretic.  HENT:     Head: Normocephalic and atraumatic.  Cardiovascular:     Rate and Rhythm: Normal rate.  Pulmonary:     Effort: Pulmonary effort is normal. No respiratory distress.  Abdominal:     General: Abdomen is flat.     Palpations: Abdomen is soft.     Tenderness: There is abdominal tenderness in the right lower quadrant. There is right CVA tenderness. There is no guarding or rebound. Negative signs include McBurney's sign.  Musculoskeletal:        General: Normal range of motion.     Cervical back: Normal range of motion.  Skin:    General: Skin is warm and dry.  Neurological:     General: No focal  deficit present.     Mental Status: She is alert.  Psychiatric:        Mood and Affect: Mood normal.        Behavior: Behavior normal.     ED Results / Procedures / Treatments   Labs (all labs ordered are listed, but only abnormal results are displayed) Labs Reviewed  URINALYSIS, ROUTINE W REFLEX MICROSCOPIC - Abnormal; Notable for the following components:      Result Value   APPearance HAZY (*)    Hgb urine dipstick MODERATE (*)    Protein, ur TRACE (*)    Leukocytes,Ua SMALL (*)    Bacteria, UA RARE (*)    All other components within normal limits  CBC - Abnormal; Notable for the following components:   WBC 12.4 (*)    Hemoglobin 11.2 (*)    MCH 25.1 (*)    RDW 16.1 (*)    All other components within normal limits  BASIC METABOLIC PANEL - Abnormal; Notable for the following components:   CO2 21 (*)    Glucose, Bld 156 (*)    Creatinine, Ser 1.22 (*)    GFR, Estimated 54 (*)    All other components within normal limits  HEPATIC FUNCTION PANEL - Abnormal; Notable for the following components:   AST 13 (*)    All other components within normal limits  URINE CULTURE    EKG None  Radiology CT Renal Stone Study Result Date: 06/05/2023 CLINICAL DATA:  Abdominal/flank pain, stone suspected Right flank pain x 3 hours. History of kidney stones. EXAM: CT ABDOMEN AND PELVIS WITHOUT CONTRAST TECHNIQUE: Multidetector CT imaging of the abdomen and pelvis was performed following the standard protocol without IV contrast. RADIATION DOSE REDUCTION: This exam was performed according to the departmental dose-optimization program which includes automated exposure control, adjustment of the mA and/or kV according to patient size and/or use of iterative reconstruction technique. COMPARISON:  CT heart 07/29/2019, CT abdomen pelvis 07/06/2009 FINDINGS: Lower chest: No acute abnormality. Partially visualized metallic density consistent with known PFO closure amplatzer device. Hepatobiliary: No  focal liver abnormality. No gallstones, gallbladder wall thickening, or pericholecystic fluid. No biliary dilatation. Pancreas: No focal lesion. Normal pancreatic contour. No surrounding inflammatory changes. No main pancreatic ductal dilatation. Spleen: Normal in size without focal abnormality. Adrenals/Urinary Tract: No adrenal nodule bilaterally. Mild right perinephric stranding. Mild right hydroureteronephrosis with associated 3 mm calcified stone at the right ureterovesicular junction. Bilateral nephrolithiasis measuring up to 2 mm. No left nephrolithiasis. No left hydroureteronephrosis. The urinary bladder is decompressed and grossly unremarkable. Stomach/Bowel: Stomach is within normal limits. No evidence of bowel wall thickening or dilatation. Appendix appears normal. Vascular/Lymphatic: No abdominal aorta or  iliac aneurysm. No abdominal, pelvic, or inguinal lymphadenopathy. Reproductive: Uterus and bilateral adnexa are unremarkable. Other: No intraperitoneal free fluid. No intraperitoneal free gas. No organized fluid collection. Musculoskeletal: No abdominal wall hernia or abnormality. No suspicious lytic or blastic osseous lesions. No acute displaced fracture. IMPRESSION: 1. Obstructive 3 mm right ureterovesicular junction stone. 2. Nonobstructive bilateral nephrolithiasis measuring up to 2 mm. 3. Otherwise limited evaluation on this noncontrast study. Electronically Signed   By: Tish Frederickson M.D.   On: 06/05/2023 17:34    Procedures Procedures    Medications Ordered in ED Medications  cefTRIAXone (ROCEPHIN) 1 g in sodium chloride 0.9 % 100 mL IVPB (has no administration in time range)  ketorolac (TORADOL) 15 MG/ML injection 15 mg (15 mg Intravenous Given 06/05/23 1711)  ondansetron (ZOFRAN) injection 4 mg (4 mg Intravenous Given 06/05/23 1710)  sodium chloride 0.9 % bolus 1,000 mL (1,000 mLs Intravenous New Bag/Given 06/05/23 1710)    ED Course/ Medical Decision Making/ A&P Clinical Course  as of 06/05/23 1804  Fri Jun 05, 2023  1804 StableNL and possible UTI [CC]    Clinical Course User Index [CC] Glyn Ade, MD                                 Medical Decision Making Amount and/or Complexity of Data Reviewed Labs: ordered. Radiology: ordered.  Risk Prescription drug management.   This patient is a 53 y.o. female who presents to the ED for concern of right flank pain, this involves an extensive number of treatment options, and is a complaint that carries with it a high risk of complications and morbidity. The emergent differential diagnosis prior to evaluation includes, but is not limited to,  AAA, renal vascular thrombosis, mesenteric ischemia, pyelonephritis, nephrolithiasis, cystitis, biliary colic, pancreatitis, PUD, appendicitis, diverticulitis, bowel obstruction, Ectopic Pregnancy, PID/TOA, Ovarian cyst, Ovarian torsion   This is not an exhaustive differential.   Past Medical History / Co-morbidities / Social History:  has a past medical history of Abrasion of right leg (04/07/2017), Dental crowns present, History of CVA (cerebrovascular accident) (2011), Migraine, Migraines, PFO (patent foramen ovale), PONV (postoperative nausea and vomiting), Proximal phalanx fracture of finger (03/2017), and Runny nose (04/07/2017).  Additional history: Chart reviewed.  Physical Exam: Physical exam performed. The pertinent findings include: right CVA tenderness  Lab Tests: I ordered, and personally interpreted labs.  The pertinent results include:  WBC 12.4, hgb 11.2, creatinine 1.22. UA with moderate hgb, small leukocytes, >50 RBCs, 11-20 WBCs, rare bacteria, 11-20 squamous cells. Potentially contaminant.    Imaging Studies: I ordered imaging studies including Ct renal. I independently visualized and interpreted imaging which showed   1. Obstructive 3 mm right ureterovesicular junction stone. 2. Nonobstructive bilateral nephrolithiasis measuring up to 2 mm. 3.  Otherwise limited evaluation on this noncontrast study.  I agree with the radiologist interpretation.   Medications: I ordered medication including zofran, toradol, rocephin, fluids  for pain, nausea/vomiting, dehydration. Reevaluation of the patient after these medicines showed that the patient improved. I have reviewed the patients home medicines and have made adjustments as needed.  Consultations Obtained: I requested consultation with the urology on call Dr. Margo Aye ,  and discussed lab and imaging findings as well as pertinent plan - they recommend: d/c with omnicef for potential UTI, urine strainer, and flomax with close outpatient urology follow-up and return precautions specifically to [facility] if development of uncontrolled pain or fevers.  He notes that with 11-20 squamous epithelial cells and only rare bacteria, very likely contaminated.  Also attributes patient's white blood cell count to her pain.   Disposition: After consideration of the diagnostic results and the patients response to treatment, I feel that emergency department workup does not suggest an emergent condition requiring admission or immediate intervention beyond what has been performed at this time. The plan is: d/c with close outpatient follow-up and return precautions. Will send for omnicef and Flomax for patient's kidney stone as well as possible UTI per urology recommendations.  Patient's urine culture is pending.  Will also send for Zofran and Percocet for pain.  PDMP reviewed.  Patient vies not to drive or operate machinery with taking this medication.  Return precautions recommended and discussed.  Given urology referral for follow-up. Evaluation and diagnostic testing in the emergency department does not suggest an emergent condition requiring admission or immediate intervention beyond what has been performed at this time.  Plan for discharge with close PCP follow-up.  Patient is understanding and amenable with plan,  educated on red flag symptoms that would prompt immediate return.  Patient discharged in stable condition.   I discussed this case with my attending physician Dr. Doran Durand who cosigned this note including patient's presenting symptoms, physical exam, and planned diagnostics and interventions. Attending physician stated agreement with plan or made changes to plan which were implemented.    Final Clinical Impression(s) / ED Diagnoses Final diagnoses:  Acute right flank pain  Kidney stone  Acute cystitis with hematuria    Rx / DC Orders ED Discharge Orders          Ordered    tamsulosin (FLOMAX) 0.4 MG CAPS capsule  Daily        06/05/23 1944    cefdinir (OMNICEF) 300 MG capsule  2 times daily        06/05/23 1944    ondansetron (ZOFRAN-ODT) 4 MG disintegrating tablet  Every 8 hours PRN        06/05/23 1944    oxyCODONE-acetaminophen (PERCOCET/ROXICET) 5-325 MG tablet  Every 6 hours PRN        06/05/23 1944          An After Visit Summary was printed and given to the patient.     Vear Clock 06/05/23 1949    Glyn Ade, MD 06/05/23 2135

## 2023-06-05 NOTE — Discharge Instructions (Addendum)
As we discussed, you have a kidney stone on the right side that is 3 mm in size and is likely the cause of your discomfort.  It should pass on its own in the next few days.  I have given you a prescription for Flomax which works to dilate your urinary tract and help the stone pass.  Please take this as prescribed every day until you either see the stone pass or stop having pain.  I have also given you a prescription for Percocet which is a narcotic pain medication which can take as prescribed as needed for severe pain only.  Do not drive or operate heavy machinery while taking this medication as it can be sedating.  I have also given you Zofran which she can take as prescribed as needed for any nausea or vomiting.  Additionally, your urine did show some signs of an infection.  We have given you 1 dose of antibiotics via IV today.  I am sending you home with [facility] which is an antibiotic you need to take as prescribed in its entirety for management of this.   I have given you a referral to urology with a number call to schedule an appointment for follow-up.  Please call at your earliest convenience.  Additionally, given that your urine showed some signs of an infection and you have a kidney stone, there is a chance that you could develop a systemic infection which would be an emergency requiring surgical removal of this stone.  Therefore if you start to feel significantly worse, start having pain that is uncontrolled or specific you feel he if you develop any fevers then you will need to return for management.  If this does happen, however I would recommend going to [facility] emergency department as this is where the urology specialist operate out of.  Return if development of any new or worsening symptoms.

## 2023-06-05 NOTE — ED Triage Notes (Signed)
Pt c/o RLQ abd pain, R flank pain onset approx 2hrs ago. Hx kidney stones, advises "peeing like a UTI- just dribbling." Hx kidney stones

## 2023-06-07 LAB — URINE CULTURE: Culture: <10000 — AB

## 2023-07-02 DIAGNOSIS — F4312 Post-traumatic stress disorder, chronic: Secondary | ICD-10-CM | POA: Diagnosis not present

## 2023-07-02 DIAGNOSIS — F411 Generalized anxiety disorder: Secondary | ICD-10-CM | POA: Diagnosis not present

## 2023-07-02 DIAGNOSIS — F332 Major depressive disorder, recurrent severe without psychotic features: Secondary | ICD-10-CM | POA: Diagnosis not present

## 2023-09-24 DIAGNOSIS — Z1211 Encounter for screening for malignant neoplasm of colon: Secondary | ICD-10-CM | POA: Diagnosis not present

## 2023-09-24 DIAGNOSIS — K219 Gastro-esophageal reflux disease without esophagitis: Secondary | ICD-10-CM | POA: Diagnosis not present

## 2023-09-24 DIAGNOSIS — G43009 Migraine without aura, not intractable, without status migrainosus: Secondary | ICD-10-CM | POA: Diagnosis not present

## 2023-09-24 DIAGNOSIS — D509 Iron deficiency anemia, unspecified: Secondary | ICD-10-CM | POA: Diagnosis not present

## 2023-09-24 DIAGNOSIS — E782 Mixed hyperlipidemia: Secondary | ICD-10-CM | POA: Diagnosis not present

## 2023-09-24 DIAGNOSIS — Z0000 Encounter for general adult medical examination without abnormal findings: Secondary | ICD-10-CM | POA: Diagnosis not present

## 2023-09-24 DIAGNOSIS — F322 Major depressive disorder, single episode, severe without psychotic features: Secondary | ICD-10-CM | POA: Diagnosis not present

## 2023-10-12 DIAGNOSIS — D509 Iron deficiency anemia, unspecified: Secondary | ICD-10-CM | POA: Diagnosis not present

## 2023-10-12 DIAGNOSIS — Z136 Encounter for screening for cardiovascular disorders: Secondary | ICD-10-CM | POA: Diagnosis not present

## 2023-10-12 DIAGNOSIS — Z0000 Encounter for general adult medical examination without abnormal findings: Secondary | ICD-10-CM | POA: Diagnosis not present

## 2023-11-06 DIAGNOSIS — U071 COVID-19: Secondary | ICD-10-CM | POA: Diagnosis not present

## 2023-11-06 DIAGNOSIS — J029 Acute pharyngitis, unspecified: Secondary | ICD-10-CM | POA: Diagnosis not present

## 2023-11-06 DIAGNOSIS — R059 Cough, unspecified: Secondary | ICD-10-CM | POA: Diagnosis not present

## 2023-11-19 ENCOUNTER — Other Ambulatory Visit: Payer: Self-pay | Admitting: Medical Oncology

## 2023-11-19 DIAGNOSIS — E611 Iron deficiency: Secondary | ICD-10-CM

## 2023-11-19 DIAGNOSIS — E782 Mixed hyperlipidemia: Secondary | ICD-10-CM | POA: Diagnosis not present

## 2023-11-19 DIAGNOSIS — F322 Major depressive disorder, single episode, severe without psychotic features: Secondary | ICD-10-CM | POA: Diagnosis not present

## 2023-11-23 ENCOUNTER — Inpatient Hospital Stay

## 2023-11-23 ENCOUNTER — Inpatient Hospital Stay: Attending: Internal Medicine | Admitting: Internal Medicine

## 2023-11-23 ENCOUNTER — Telehealth: Payer: Self-pay | Admitting: Pharmacy Technician

## 2023-11-23 ENCOUNTER — Other Ambulatory Visit: Payer: Self-pay | Admitting: Internal Medicine

## 2023-11-23 VITALS — BP 126/83 | HR 86 | Temp 97.7°F | Resp 17 | Ht 69.0 in | Wt 191.0 lb

## 2023-11-23 DIAGNOSIS — E611 Iron deficiency: Secondary | ICD-10-CM

## 2023-11-23 DIAGNOSIS — D500 Iron deficiency anemia secondary to blood loss (chronic): Secondary | ICD-10-CM | POA: Diagnosis not present

## 2023-11-23 DIAGNOSIS — Z809 Family history of malignant neoplasm, unspecified: Secondary | ICD-10-CM

## 2023-11-23 DIAGNOSIS — D508 Other iron deficiency anemias: Secondary | ICD-10-CM

## 2023-11-23 DIAGNOSIS — N920 Excessive and frequent menstruation with regular cycle: Secondary | ICD-10-CM | POA: Diagnosis present

## 2023-11-23 DIAGNOSIS — D509 Iron deficiency anemia, unspecified: Secondary | ICD-10-CM | POA: Insufficient documentation

## 2023-11-23 DIAGNOSIS — E282 Polycystic ovarian syndrome: Secondary | ICD-10-CM

## 2023-11-23 LAB — CBC WITH DIFFERENTIAL (CANCER CENTER ONLY)
Abs Immature Granulocytes: 0.01 K/uL (ref 0.00–0.07)
Basophils Absolute: 0.1 K/uL (ref 0.0–0.1)
Basophils Relative: 1 %
Eosinophils Absolute: 0.3 K/uL (ref 0.0–0.5)
Eosinophils Relative: 4 %
HCT: 34.7 % — ABNORMAL LOW (ref 36.0–46.0)
Hemoglobin: 10.8 g/dL — ABNORMAL LOW (ref 12.0–15.0)
Immature Granulocytes: 0 %
Lymphocytes Relative: 35 %
Lymphs Abs: 2.5 K/uL (ref 0.7–4.0)
MCH: 24.4 pg — ABNORMAL LOW (ref 26.0–34.0)
MCHC: 31.1 g/dL (ref 30.0–36.0)
MCV: 78.3 fL — ABNORMAL LOW (ref 80.0–100.0)
Monocytes Absolute: 0.6 K/uL (ref 0.1–1.0)
Monocytes Relative: 9 %
Neutro Abs: 3.6 K/uL (ref 1.7–7.7)
Neutrophils Relative %: 51 %
Platelet Count: 365 K/uL (ref 150–400)
RBC: 4.43 MIL/uL (ref 3.87–5.11)
RDW: 17.4 % — ABNORMAL HIGH (ref 11.5–15.5)
WBC Count: 7.1 K/uL (ref 4.0–10.5)
nRBC: 0 % (ref 0.0–0.2)

## 2023-11-23 LAB — CMP (CANCER CENTER ONLY)
ALT: 8 U/L (ref 0–44)
AST: 10 U/L — ABNORMAL LOW (ref 15–41)
Albumin: 4.1 g/dL (ref 3.5–5.0)
Alkaline Phosphatase: 69 U/L (ref 38–126)
Anion gap: 7 (ref 5–15)
BUN: 11 mg/dL (ref 6–20)
CO2: 25 mmol/L (ref 22–32)
Calcium: 9 mg/dL (ref 8.9–10.3)
Chloride: 107 mmol/L (ref 98–111)
Creatinine: 0.88 mg/dL (ref 0.44–1.00)
GFR, Estimated: >60 mL/min (ref >60)
Glucose, Bld: 94 mg/dL (ref 70–99)
Potassium: 4.1 mmol/L (ref 3.5–5.1)
Sodium: 139 mmol/L (ref 135–145)
Total Bilirubin: 0.3 mg/dL (ref 0.0–1.2)
Total Protein: 6.9 g/dL (ref 6.5–8.1)

## 2023-11-23 LAB — IRON AND IRON BINDING CAPACITY (CC-WL,HP ONLY)
Iron: 25 ug/dL — ABNORMAL LOW (ref 28–170)
Saturation Ratios: 7 % — ABNORMAL LOW (ref 10.4–31.8)
TIBC: 371 ug/dL (ref 250–450)
UIBC: 346 ug/dL (ref 148–442)

## 2023-11-23 LAB — FERRITIN: Ferritin: 8 ng/mL — ABNORMAL LOW (ref 11–307)

## 2023-11-23 LAB — VITAMIN B12: Vitamin B-12: 151 pg/mL — ABNORMAL LOW (ref 180–914)

## 2023-11-23 LAB — FOLATE: Folate: 6.1 ng/mL (ref >5.9)

## 2023-11-23 MED ORDER — INTEGRA PLUS PO CAPS: ORAL_CAPSULE | ORAL | 2 refills | Status: AC

## 2023-11-23 NOTE — Progress Notes (Signed)
 [facility] [phone]   [phone]  CONSULT NOTE  REFERRING PHYSICIAN: Charmaine Bright, PA-C  REASON FOR CONSULTATION:  53 years old white female with iron deficiency anemia  HPI Christina Goodman is a 53 y.o. female.   HPI  Discussed the use of AI scribe software for clinical note transcription with the patient, who gave verbal consent to proceed.  History of Present Illness Christina Goodman is a 53 year old female with iron deficiency anemia who presents for evaluation of persistent anemia.  She has experienced dizziness for over a year, which led to the initial discovery of her iron deficiency anemia. She takes over-the-counter iron supplements, 65 mg every other day, due to constipation issues when taken daily. Despite this regimen, her iron levels have remained low on prior testing, with a previous hemoglobin of 11.9 and iron level of 37. She experiences nausea and constipation from the iron supplements.  She experiences heavy menstrual periods with clots, although they are not regular due to her polycystic ovary syndrome (PCOS). Her last colonoscopy was five years ago, following her mother's history of colon cancer diagnosed at age 23.  She has a history of migraines, which she associates with dizziness and vertigo. She also reports craving salt but not ice. She has been working on weight loss and recently had COVID-19, which resulted in a lingering cough.  Her past medical history includes migraines, patent foramen ovale (PFO), a stroke in 2011, PCOS, high cholesterol, anxiety, and depression. She is on disability due to migraines and has a history of kidney stones. No history of diabetes, heart attacks, or high blood pressure, except when experiencing headaches.  Family history is significant for her mother having colon cancer and her father having heart disease. Her sister was recently diagnosed with autonomic dysfunction. Her father is also  anemic, raising questions about a possible genetic component.     Past Medical History:  Diagnosis Date   Abrasion of right leg 04/07/2017   Dental crowns present    History of CVA (cerebrovascular accident) 2011   no deficits   Migraine    Migraines    PFO (patent foramen ovale)    PONV (postoperative nausea and vomiting)    Proximal phalanx fracture of finger 03/2017   left small   Runny nose 04/07/2017   clear drainage, per pt.      Past Surgical History:  Procedure Laterality Date   CESAREAN SECTION  04/25/2009   CLOSED REDUCTION FINGER WITH PERCUTANEOUS PINNING Left 04/09/2017   Procedure: LEFT SMALL FINGER CLOSED REDUCTION WITH PERCUTANEOUS PINNING;  Surgeon: Murrell Drivers, MD;  Location: [facility];  Service: Orthopedics;  Laterality: Left;   LYSIS OF ADHESION Left 11/28/2021   Procedure: LYSIS OF ADHESION/MANIPULATION LEVORA;  Surgeon: Cristy Bonner DASEN, MD;  Location: [facility];  Service: Orthopedics;  Laterality: Left;   MASTOIDECTOMY     PATENT FORAMEN OVALE(PFO) CLOSURE N/A 04/16/2018   Procedure: PATENT FORAMEN OVALE (PFO) CLOSURE;  Surgeon: Wonda Sharper, MD;  Location: [facility] INVASIVE CV LAB;  Service: Cardiovascular;  Laterality: N/A;   SHOULDER ARTHROSCOPY Left 11/28/2021   Procedure: ARTHROSCOPY SHOULDER/DEBRIDMENT;  Surgeon: Cristy Bonner DASEN, MD;  Location: [facility];  Service: Orthopedics;  Laterality: Left;   TEE WITHOUT CARDIOVERSION N/A 08/24/2013   Procedure: TRANSESOPHAGEAL ECHOCARDIOGRAM (TEE);  Surgeon: Ezra GORMAN Shuck, MD;  Location: [facility] ENDOSCOPY;  Service: Cardiovascular;  Laterality: N/A;   TMJ ARTHROPLASTY  TYMPANOSTOMY TUBE PLACEMENT Bilateral     Family History  Problem Relation Age of Onset   Cancer Mother 17   CAD Father    Heart attack Father    Heart attack Paternal Grandfather    Heart attack Paternal Grandmother    Heart attack Maternal Grandfather    Heart attack Maternal Grandmother      Social History Social History   Tobacco Use   Smoking status: Never   Smokeless tobacco: Never  Vaping Use   Vaping status: Never Used  Substance Use Topics   Alcohol use: No    Comment: occasionally   Drug use: No    Allergies  Allergen Reactions   Ivp Dye [Iodinated Contrast Media] Hives   Penicillins Hives    Childhood Has patient had a PCN reaction causing immediate rash, facial/tongue/throat swelling, SOB or lightheadedness with hypotension: No Has patient had a PCN reaction causing severe rash involving mucus membranes or skin necrosis: Yes Has patient had a PCN reaction that required hospitalization: No Has patient had a PCN reaction occurring within the last 10 years: No If all of the above answers are NO, then may proceed with Cephalosporin use.    Sulfa Antibiotics Hives   Tape Other (See Comments)    BLOOD BLISTERS    Current Outpatient Medications  Medication Sig Dispense Refill   aspirin  81 MG tablet Take 81 mg by mouth daily. Take 2 low dose asprin daily     cefdinir  (OMNICEF ) 300 MG capsule Take 1 capsule (300 mg total) by mouth 2 (two) times daily. 14 capsule 0   cetirizine  (ZYRTEC  ALLERGY) 10 MG tablet Take 1 tablet (10 mg total) by mouth daily. 10 tablet 0   Coenzyme Q10 (CO Q 10 PO) Take 1 tablet by mouth daily.     diclofenac  (VOLTAREN ) 75 MG EC tablet Take 1 tablet (75 mg total) by mouth 2 (two) times daily. 60 tablet 0   escitalopram (LEXAPRO) 5 MG tablet Take 5 mg by mouth daily.     famotidine  (PEPCID ) 20 MG tablet Take 20 mg by mouth at bedtime.     gabapentin  (NEURONTIN ) 300 MG capsule Take 1 capsule by mouth in the morning, at noon, and at bedtime.     MAGNESIUM  PO Take 500 mg by mouth daily.      ondansetron  (ZOFRAN -ODT) 4 MG disintegrating tablet Take 1 tablet (4 mg total) by mouth every 8 (eight) hours as needed. 20 tablet 0   oxyCODONE -acetaminophen  (PERCOCET/ROXICET) 5-325 MG tablet Take 1 tablet by mouth every 6 (six) hours as needed  for severe pain (pain score 7-10). 12 tablet 0   promethazine (PHENERGAN) 25 MG suppository 1 suppository     propranolol  ER (INDERAL  LA) 60 MG 24 hr capsule Take 1 capsule (60 mg total) by mouth daily. 90 capsule 3   QULIPTA 10 MG TABS Take 1 tablet by mouth daily.     rosuvastatin  (CRESTOR ) 10 MG tablet Take 1 tablet (10 mg total) by mouth daily. 90 tablet 3   tamsulosin  (FLOMAX ) 0.4 MG CAPS capsule Take 1 capsule (0.4 mg total) by mouth daily. 30 capsule 0   tiZANidine  (ZANAFLEX ) 4 MG tablet Take 1 tablet by mouth 3 times daily as needed for muscle spasms 30 tablet 3   zonisamide (ZONEGRAN) 100 MG capsule Take 200 mg by mouth at bedtime.     No current facility-administered medications for this visit.    Review of Systems  Constitutional: positive for fatigue Eyes: negative Ears,  nose, mouth, throat, and face: negative Respiratory: negative Cardiovascular: negative Gastrointestinal: negative Genitourinary:negative Integument/breast: negative Hematologic/lymphatic: negative Musculoskeletal:negative Neurological: negative Behavioral/Psych: negative Endocrine: negative Allergic/Immunologic: negative  Physical Exam  MJO:jozmu, healthy, no distress, well nourished, and well developed SKIN: skin color, texture, turgor are normal, no rashes or significant lesions HEAD: Normocephalic, No masses, lesions, tenderness or abnormalities EYES: normal, PERRLA, Conjunctiva are pink and non-injected EARS: External ears normal, Canals clear OROPHARYNX:no exudate, no erythema, and lips, buccal mucosa, and tongue normal  NECK: supple, no adenopathy, no JVD LYMPH:  no palpable lymphadenopathy, no hepatosplenomegaly BREAST:not examined LUNGS: clear to auscultation , and palpation HEART: regular rate & rhythm, no murmurs, and no gallops ABDOMEN:abdomen soft, non-tender, normal bowel sounds, and no masses or organomegaly BACK: Back symmetric, no curvature., No CVA tenderness EXTREMITIES:no  joint deformities, effusion, or inflammation, no edema  NEURO: alert & oriented x 3 with fluent speech, no focal motor/sensory deficits  PERFORMANCE STATUS: ECOG 1  LABORATORY DATA: Lab Results  Component Value Date   WBC 12.4 (H) 06/05/2023   HGB 11.2 (L) 06/05/2023   HCT 36.5 06/05/2023   MCV 81.8 06/05/2023   PLT 341 06/05/2023      Chemistry      Component Value Date/Time   NA 137 06/05/2023 1519   NA 143 07/27/2019 1226   K 4.0 06/05/2023 1519   CL 106 06/05/2023 1519   CO2 21 (L) 06/05/2023 1519   BUN 12 06/05/2023 1519   BUN 17 07/27/2019 1226   CREATININE 1.22 (H) 06/05/2023 1519      Component Value Date/Time   CALCIUM  9.4 06/05/2023 1519   ALKPHOS 70 06/05/2023 1519   AST 13 (L) 06/05/2023 1519   ALT 11 06/05/2023 1519   BILITOT 0.4 06/05/2023 1519       RADIOGRAPHIC STUDIES: No results found.  ASSESSMENT AND PLAN: Assessment and Plan Assessment & Plan Iron deficiency anemia with heavy menstrual bleeding and polycystic ovary syndrome Iron deficiency anemia persists despite oral iron supplementation. Hemoglobin decreased from 11.9 to 10.8. Heavy menstrual bleeding likely contributing to anemia. Constipation and nausea reported with current iron supplementation. Potential genetic component considered due to family history of anemia. - Order stool cards to test for occult blood in stool. - Await results of iron studies, ferritin, B12, and folic acid levels. - If iron studies are low, arrange for iron infusion at [facility]. - Prescribe Integraplus capsule to be taken every other day with a sip of orange juice. - Schedule follow-up appointment in two months to reassess condition. The patient was advised to call immediately if she has any concerning symptoms in the interval.  The patient voices understanding of current disease status and treatment options and is in agreement with the current care plan.  All questions were answered. The  patient knows to call the clinic with any problems, questions or concerns. We can certainly see the patient Goodman sooner if necessary.  Thank you so Goodman for allowing me to participate in the care of Christina Goodman. I will continue to follow up the patient with you and assist in her care.  The total time spent in the appointment was 60 minutes including review of chart and various tests results, discussions about plan of care and coordination of care plan .   Disclaimer: This note was dictated with voice recognition software. Similar sounding words can inadvertently be transcribed and may not be corrected upon review.   Christina Goodman November 23, 2023, 11:27  AM

## 2023-11-23 NOTE — Telephone Encounter (Signed)
 Auth Submission: NO AUTH NEEDED Site of care: Site of care: [facility] Payer: [employer] MEDICARE Medication & CPT/J Code(s) submitted: Venofer (Iron Sucrose) J1756 Diagnosis Code:  Route of submission (phone, fax, portal):  Phone # Fax # Auth type: Buy/Bill PB Units/visits requested: 300mg  x3 doses Reference number:  Approval from: 11/23/23 to 02/23/24

## 2023-11-24 ENCOUNTER — Telehealth: Payer: Self-pay

## 2023-11-24 ENCOUNTER — Telehealth: Payer: Self-pay | Admitting: Internal Medicine

## 2023-11-24 NOTE — Telephone Encounter (Signed)
Scheduled appointments with the patient

## 2023-11-24 NOTE — Telephone Encounter (Signed)
 Spoke with patient regarding recent lab results. Per Dr. Sherrod, patient's iron  and B12 levels are low. Orders have been placed for the patient to receive IV iron  at the [facility] location. Patient is aware of the upcoming appointments. Instructed patient to begin taking an over-the-counter B12 supplement, 1,000 mcg daily. Patient voiced understanding.

## 2023-11-30 ENCOUNTER — Encounter (HOSPITAL_COMMUNITY): Payer: Self-pay

## 2023-11-30 ENCOUNTER — Ambulatory Visit (INDEPENDENT_AMBULATORY_CARE_PROVIDER_SITE_OTHER)

## 2023-11-30 ENCOUNTER — Emergency Department (HOSPITAL_COMMUNITY)
Admission: EM | Admit: 2023-11-30 | Discharge: 2023-11-30 | Disposition: A | Discharge status: Home / Self Care | Attending: Emergency Medicine | Admitting: Emergency Medicine

## 2023-11-30 VITALS — BP 155/98 | HR 67 | Temp 97.7°F | Resp 14 | Ht 69.0 in | Wt 186.4 lb

## 2023-11-30 DIAGNOSIS — T7840XA Allergy, unspecified, initial encounter: Secondary | ICD-10-CM | POA: Diagnosis present

## 2023-11-30 DIAGNOSIS — T782XXA Anaphylactic shock, unspecified, initial encounter: Secondary | ICD-10-CM | POA: Diagnosis not present

## 2023-11-30 DIAGNOSIS — T454X5A Adverse effect of iron and its compounds, initial encounter: Secondary | ICD-10-CM | POA: Diagnosis not present

## 2023-11-30 DIAGNOSIS — Z8673 Personal history of transient ischemic attack (TIA), and cerebral infarction without residual deficits: Secondary | ICD-10-CM | POA: Insufficient documentation

## 2023-11-30 DIAGNOSIS — D508 Other iron deficiency anemias: Secondary | ICD-10-CM

## 2023-11-30 DIAGNOSIS — Z7982 Long term (current) use of aspirin: Secondary | ICD-10-CM | POA: Diagnosis not present

## 2023-11-30 MED ORDER — EPINEPHRINE 0.3 MG/0.3ML IJ SOAJ: 0.3000 mg | INTRAMUSCULAR | 0 refills | Status: AC | PRN

## 2023-11-30 MED ORDER — SODIUM CHLORIDE 0.9 % IV SOLN: Freq: Once | INTRAVENOUS | Status: AC | PRN

## 2023-11-30 MED ORDER — EPINEPHRINE 0.3 MG/0.3ML IJ SOAJ: 0.3000 mg | Freq: Once | INTRAMUSCULAR | Status: DC | PRN

## 2023-11-30 MED ORDER — FAMOTIDINE IN NACL 20-0.9 MG/50ML-% IV SOLN
20.0000 mg | Freq: Once | INTRAVENOUS | Status: AC | PRN
  Administered 2023-11-30 (×2): 20 mg via INTRAVENOUS

## 2023-11-30 MED ORDER — ALBUTEROL SULFATE HFA 108 (90 BASE) MCG/ACT IN AERS
2.0000 | INHALATION_SPRAY | Freq: Once | RESPIRATORY_TRACT | Status: AC | PRN
  Administered 2023-11-30 (×2): 2 via RESPIRATORY_TRACT

## 2023-11-30 MED ORDER — METHYLPREDNISOLONE SODIUM SUCC 125 MG IJ SOLR
125.0000 mg | Freq: Once | INTRAMUSCULAR | Status: AC | PRN
  Administered 2023-11-30 (×2): 125 mg via INTRAVENOUS

## 2023-11-30 MED ORDER — EPINEPHRINE 0.3 MG/0.3ML IJ SOAJ
0.3000 mg | Freq: Once | INTRAMUSCULAR | Status: AC
  Administered 2023-11-30 (×2): 0.3 mg via INTRAMUSCULAR
  Filled 2023-11-30: qty 0.3

## 2023-11-30 MED ORDER — DIPHENHYDRAMINE HCL 50 MG/ML IJ SOLN
50.0000 mg | Freq: Once | INTRAMUSCULAR | Status: AC | PRN
Start: 2023-11-30 — End: 2023-11-30
  Administered 2023-11-30 (×2): 50 mg via INTRAVENOUS

## 2023-11-30 MED ORDER — DIPHENHYDRAMINE HCL 50 MG/ML IJ SOLN
50.0000 mg | Freq: Once | INTRAMUSCULAR | Status: AC
  Administered 2023-11-30 (×2): 50 mg via INTRAVENOUS
  Filled 2023-11-30: qty 1

## 2023-11-30 MED ORDER — SODIUM CHLORIDE 0.9 % IV SOLN
300.0000 mg | INTRAVENOUS | Status: DC
  Administered 2023-11-30 (×2): 300 mg via INTRAVENOUS
  Filled 2023-11-30: qty 15

## 2023-11-30 NOTE — ED Provider Notes (Signed)
 [place] EMERGENCY DEPARTMENT AT [facility] Provider Note  CSN: [id number] Arrival date & time: 11/30/23 1710  Chief Complaint(s) Allergic Reaction  HPI Christina Goodman is a 53 y.o. female history of migraine, PFO presenting to the emergency department with allergic reaction.  Patient reports that she has chronic iron  deficiency anemia, has been seeing oncology.  She had an iron  infusion today for the first time.  She reports that right after the infusion she developed itching, lightheadedness, scratchy throat, rash.  Blood pressure was documented as low as 70s systolic.  She received fluids, Benadryl , Pepcid , steroids, albuterol , and was feeling better however on arrival to the emergency department she is starting to feel recurrent itching and scratchy throat and coughing.  Reports that she had a similar allergic reaction once to sulfa.   Past Medical History Past Medical History:  Diagnosis Date   Abrasion of right leg 04/07/2017   Dental crowns present    History of CVA (cerebrovascular accident) 2011   no deficits   Migraine    Migraines    PFO (patent foramen ovale)    PONV (postoperative nausea and vomiting)    Proximal phalanx fracture of finger 03/2017   left small   Runny nose 04/07/2017   clear drainage, per pt.   Patient Active Problem List   Diagnosis Date Noted   IDA (iron  deficiency anemia) 11/23/2023   PFO with atrial septal aneurysm 04/16/2018   Palpitations 01/27/2018   Migraine headache 08/02/2013   CVA (cerebral vascular accident) (HCC) 08/02/2013   Home Medication(s) Prior to Admission medications   Medication Sig Start Date End Date Taking? Authorizing Provider  ADVAIR HFA 230-21 MCG/ACT inhaler Inhale 2 puffs into the lungs 2 (two) times daily as needed (for flares).   Yes [provider]  aspirin  81 MG tablet Take 81 mg by mouth at bedtime.   Yes [provider]  buPROPion (WELLBUTRIN XL) 150 MG 24 hr tablet Take 150 mg by  mouth daily.   Yes [provider]  Coenzyme Q10 (CO Q 10 PO) Take 1 capsule by mouth daily.   Yes [provider]  EPINEPHrine  0.3 mg/0.3 mL IJ SOAJ injection Inject 0.3 mg into the muscle as needed for anaphylaxis. 11/30/23  Yes Francesca Elsie CROME, MD  famotidine  (PEPCID ) 20 MG tablet Take 20 mg by mouth at bedtime. 02/28/21  Yes [provider]  gabapentin  (NEURONTIN ) 300 MG capsule Take 300 mg by mouth in the morning and at bedtime. 11/26/20  Yes [provider]  hydrOXYzine (ATARAX) 50 MG tablet Take 50 mg by mouth at bedtime as needed (for sleep).   Yes [provider]  ibuprofen  (ADVIL ) 200 MG tablet Take 400-600 mg by mouth every 8 (eight) hours as needed for mild pain (pain score 1-3) or headache.   Yes [provider]  MAGNESIUM  PO Take 500 mg by mouth at bedtime.   Yes [provider]  methylphenidate 54 MG PO CR tablet Take 54 mg by mouth in the morning.   Yes [provider]  nortriptyline (PAMELOR) 10 MG capsule Take 10 mg by mouth at bedtime.   Yes [provider]  omeprazole (PRILOSEC) 20 MG capsule Take 20 mg by mouth daily before breakfast.   Yes [provider]  ondansetron  (ZOFRAN -ODT) 4 MG disintegrating tablet Take 1 tablet (4 mg total) by mouth every 8 (eight) hours as needed. 06/05/23  Yes Smoot, Sarah A, PA-C  promethazine (PHENERGAN) 25 MG suppository Place  25 mg rectally every 8 (eight) hours as needed for nausea or vomiting. 08/07/21  Yes [provider]  QULIPTA 30 MG TABS Take 30 mg by mouth at bedtime.   Yes [provider]  Rosuvastatin  Calcium  (CRESTOR  PO) Take 1 tablet by mouth at bedtime.   Yes [provider]  sertraline (ZOLOFT) 100 MG tablet Take 100 mg by mouth daily.   Yes [provider]  tiZANidine  (ZANAFLEX ) 4 MG capsule Take 4 mg by mouth 3 (three) times daily as needed for muscle spasms.   Yes [provider]  UBRELVY  100 MG  TABS Take 100 mg by mouth daily as needed (for a migraine. May repeat once in 2 hours, if no relief.).   Yes [provider]  zonisamide (ZONEGRAN) 100 MG capsule Take 200 mg by mouth at bedtime. 03/22/21  Yes [provider]  cefdinir  (OMNICEF ) 300 MG capsule Take 1 capsule (300 mg total) by mouth 2 (two) times daily. Patient not taking: Reported on 11/30/2023 06/05/23   Smoot, Lauraine LABOR, PA-C  cetirizine  (ZYRTEC  ALLERGY) 10 MG tablet Take 1 tablet (10 mg total) by mouth daily. Patient not taking: Reported on 11/30/2023 09/25/21   Renae Bernarda HERO, PA-C  diclofenac  (VOLTAREN ) 75 MG EC tablet Take 1 tablet (75 mg total) by mouth 2 (two) times daily. Patient not taking: Reported on 11/30/2023 11/28/21   McBane, Caroline N, PA-C  FeFum-FePoly-FA-B Cmp-C-Biot (INTEGRA PLUS ) CAPS 1 capsule p.o. every other day with some orange juice Patient not taking: Reported on 11/30/2023 11/23/23   Sherrod Sherrod, MD  oxyCODONE -acetaminophen  (PERCOCET/ROXICET) 5-325 MG tablet Take 1 tablet by mouth every 6 (six) hours as needed for severe pain (pain score 7-10). Patient not taking: Reported on 11/30/2023 06/05/23   Smoot, Lauraine LABOR, PA-C  propranolol  ER (INDERAL  LA) 60 MG 24 hr capsule Take 1 capsule (60 mg total) by mouth daily. Patient not taking: Reported on 11/30/2023 03/27/21   Swinyer, Rosaline HERO, NP  rosuvastatin  (CRESTOR ) 10 MG tablet Take 1 tablet (10 mg total) by mouth daily. Patient not taking: Reported on 11/30/2023 03/27/21   Swinyer, Rosaline HERO, NP  tamsulosin  (FLOMAX ) 0.4 MG CAPS capsule Take 1 capsule (0.4 mg total) by mouth daily. Patient not taking: Reported on 11/30/2023 06/05/23   Smoot, Lauraine LABOR, PA-C  tiZANidine  (ZANAFLEX ) 4 MG tablet Take 1 tablet by mouth 3 times daily as needed for muscle spasms Patient not taking: Reported on 11/30/2023 11/29/20                                                                                                                                       Past  Surgical History Past Surgical History:  Procedure Laterality Date   CESAREAN SECTION  04/25/2009   CLOSED REDUCTION FINGER WITH PERCUTANEOUS PINNING Left 04/09/2017   Procedure: LEFT SMALL FINGER CLOSED REDUCTION WITH PERCUTANEOUS PINNING;  Surgeon: Murrell Drivers, MD;  Location:  [facility];  Service: Orthopedics;  Laterality: Left;   LYSIS OF ADHESION Left 11/28/2021   Procedure: LYSIS OF ADHESION/MANIPULATION LEVORA;  Surgeon: Cristy Bonner DASEN, MD;  Location: [facility];  Service: Orthopedics;  Laterality: Left;   MASTOIDECTOMY     PATENT FORAMEN OVALE(PFO) CLOSURE N/A 04/16/2018   Procedure: PATENT FORAMEN OVALE (PFO) CLOSURE;  Surgeon: Wonda Sharper, MD;  Location: [facility] INVASIVE CV LAB;  Service: Cardiovascular;  Laterality: N/A;   SHOULDER ARTHROSCOPY Left 11/28/2021   Procedure: ARTHROSCOPY SHOULDER/DEBRIDMENT;  Surgeon: Cristy Bonner DASEN, MD;  Location: [facility];  Service: Orthopedics;  Laterality: Left;   TEE WITHOUT CARDIOVERSION N/A 08/24/2013   Procedure: TRANSESOPHAGEAL ECHOCARDIOGRAM (TEE);  Surgeon: Ezra GORMAN Shuck, MD;  Location: [facility] ENDOSCOPY;  Service: Cardiovascular;  Laterality: N/A;   TMJ ARTHROPLASTY     TYMPANOSTOMY TUBE PLACEMENT Bilateral    Family History Family History  Problem Relation Age of Onset   Cancer Mother 44   CAD Father    Heart attack Father    Heart attack Paternal Grandfather    Heart attack Paternal Grandmother    Heart attack Maternal Grandfather    Heart attack Maternal Grandmother     Social History Social History   Tobacco Use   Smoking status: Never   Smokeless tobacco: Never  Vaping Use   Vaping status: Never Used  Substance Use Topics   Alcohol use: No    Comment: occasionally   Drug use: No   Allergies Erenumab-aooe, Triptans, Iodinated contrast media, Penicillins, Sulfa antibiotics, Tape, Wound dressing adhesive, Iron  sucrose, Sertraline, and Other  Review of Systems Review of Systems   All other systems reviewed and are negative.   Physical Exam Vital Signs  I have reviewed the triage vital signs BP (!) 127/93   Pulse 74   Temp 98.5 F (36.9 C) (Oral)   Resp 16   Ht 5' 9 (1.753 m)   Wt 83.9 kg   SpO2 99%   BMI 27.32 kg/m  Physical Exam Vitals and nursing note reviewed.  Constitutional:      General: She is not in acute distress.    Appearance: She is well-developed.  HENT:     Head: Normocephalic and atraumatic.     Mouth/Throat:     Mouth: Mucous membranes are moist.     Comments: No tongue swelling or oropharyngeal swelling Eyes:     Pupils: Pupils are equal, round, and reactive to light.  Cardiovascular:     Rate and Rhythm: Normal rate and regular rhythm.     Heart sounds: No murmur heard. Pulmonary:     Effort: Pulmonary effort is normal. No respiratory distress.     Breath sounds: Normal breath sounds. No stridor.  Abdominal:     General: Abdomen is flat.     Palpations: Abdomen is soft.     Tenderness: There is no abdominal tenderness.  Musculoskeletal:        General: No tenderness.     Right lower leg: No edema.     Left lower leg: No edema.  Skin:    General: Skin is warm and dry.     Comments: Flushing and urticarial rash of her face.  Neurological:     General: No focal deficit present.     Mental Status: She is alert. Mental status is at baseline.  Psychiatric:        Mood and Affect: Mood normal.        Behavior: Behavior  normal.     ED Results and Treatments Labs (all labs ordered are listed, but only abnormal results are displayed) Labs Reviewed - No data to display                                                                                                                        Radiology No results found.  Pertinent labs & imaging results that were available during my care of the patient were reviewed by me and considered in my medical decision making (see MDM for details).  Medications Ordered in  ED Medications  EPINEPHrine  (EPI-PEN) injection 0.3 mg (0.3 mg Intramuscular Given 11/30/23 1817)  diphenhydrAMINE  (BENADRYL ) injection 50 mg (50 mg Intravenous Given 11/30/23 2058)                                                                                                                                     Procedures Procedures  (including critical care time)  Medical Decision Making / ED Course   MDM:  53 year old presented to the emergency department with allergic reaction.  Suspect patient probably anaphylactic reaction.  She was hypotensive and lightheaded and also developed diffuse hives and throat swelling and difficulty breathing.  Symptoms overall improved with typical allergic reaction medication however her symptoms are worsening again so we will give her a EpiPen  and observe 4 hours  Clinical Course as of 11/30/23 2226  Mon Nov 30, 2023  1845 Patient feeling better after epi pen. Will continue to observe [WS]  2225 Patient stable, had some itching and received additional IV Benadryl .  Given her symptoms being concerning for anaphylactic reaction we will also prescribe EpiPen .  Discussed possibility of rebound reaction and need to return should this occur.  Recommend she follow-up with her hematologist regarding allergic reaction. Will discharge patient to home. All questions answered. Patient comfortable with plan of discharge. Return precautions discussed with patient and specified on the after visit summary.  [WS]    Clinical Course User Index [WS] Francesca Elsie CROME, MD     Additional history obtained: -Additional history obtained from ems -External records from outside source obtained and reviewed including: Chart review including previous notes, labs, imaging, consultation notes including infusion notes    Medicines ordered and prescription drug management: Meds ordered this encounter  Medications   EPINEPHrine  (EPI-PEN) injection 0.3 mg   diphenhydrAMINE   (BENADRYL ) injection 50 mg   EPINEPHrine   0.3 mg/0.3 mL IJ SOAJ injection    Sig: Inject 0.3 mg into the muscle as needed for anaphylaxis.    Dispense:  1 each    Refill:  0    -I have reviewed the patients home medicines and have made adjustments as needed    Reevaluation: After the interventions noted above, I reevaluated the patient and found that their symptoms have improved  Co morbidities that complicate the patient evaluation  Past Medical History:  Diagnosis Date   Abrasion of right leg 04/07/2017   Dental crowns present    History of CVA (cerebrovascular accident) 2011   no deficits   Migraine    Migraines    PFO (patent foramen ovale)    PONV (postoperative nausea and vomiting)    Proximal phalanx fracture of finger 03/2017   left small   Runny nose 04/07/2017   clear drainage, per pt.      Dispostion: Disposition decision including need for hospitalization was considered, and patient discharged from emergency department.    Final Clinical Impression(s) / ED Diagnoses Final diagnoses:  Anaphylaxis, initial encounter     This chart was dictated using voice recognition software.  Despite best efforts to proofread,  errors can occur which can change the documentation meaning.    Francesca Elsie CROME, MD 11/30/23 2226

## 2023-11-30 NOTE — Progress Notes (Signed)
 Diagnosis: Iron  Deficiency Anemia  Provider:  Praveen Mannam MD  Procedure: IV Infusion  IV Type: Peripheral, IV Location: R Antecubital  Venofer  (Iron  Sucrose), Dose: 300 mg  Infusion Start Time: 1347  Infusion Stop Time: 1524  Post Infusion IV Care: Patient had symptoms at end of infusion. See below.  Discharge: Condition: Good, Destination: hospital .   Performed by:  Maximiano JONELLE Pouch, LPN   At 8475 while infusion was almost complete with 10 ml left, patient complained of symptoms including flushing, redness to neck,on face, and back itching on head and neck. Lips and ears were also burning. Pt also had a cough that was a new onset. Emergency protocols initiated and emergency medications administered including Benadryl  50 mg IVP at 1548, Solumedrol 125 mg IVP at 1547, NS 1000 mL bolus at 1606, Pepcid  20 mg IVPB at 1549, and Albuterol  inhaler (2 puffs) at 1552 . One episode of hypotension initially, but vital signs otherwise stable. After completion of Pepcid , patient observed for additional 15 minutes, and then stated that symptoms were returning/worsening. EMS called at 1631 and arrived at 1650. Unable to reach ordering provider initially, and so Dr. Lonna Coder notified via secure message at 1546. No additional orders. Ordering provider's office (Dr. Sherrod Sherrod, MD) was called at 1617; no answer - left voicemail and sent secure message. Patient was transported to [facility] via EMS.

## 2023-11-30 NOTE — Discharge Instructions (Addendum)
 We evaluated you for your reaction to the iron  infusion.  We suspect that you had a severe type of allergic reaction called anaphylaxis.  We had to give you an EpiPen  in the emergency department.  Your symptoms have improved and we have observed you without worsening.  If you have ongoing itching, please take 25-50 mg of Benadryl  every 6 hours as needed for itching.  If you have severe allergic reaction symptoms like difficulty breathing, lightheadedness or fainting, uncontrolled vomiting, swelling to your tongue or face, then please use your EpiPen  and return to the emergency department immediately.  Since you had this type of reaction, please discuss with Dr. Sherrod.

## 2023-11-30 NOTE — ED Triage Notes (Signed)
 Pt BIB ems after having an allergic rxn to her iron  infusion at her doctors office. S/S included hives, SOB, lip swelling, and itching. Pt states lip swelling, SOB, hives are better but the itching and a little cough is still there. Pt given IV bolus NS 200ml, pepcid  20mg , solu-medral 125mg , albuterol  1.08mg , Epi was not given. A&O x4, VS stable.

## 2023-12-01 ENCOUNTER — Telehealth: Payer: Self-pay

## 2023-12-01 NOTE — Telephone Encounter (Signed)
 Secure message received from Dr. Sherrod requesting to cancel patient's subsequent iron  infusion appointments, pending her previously-scheduled follow-up appointment with his office.  Called patient. No answer. Left voicemail letting the patient know that the appointments would be cancelled, and providing the infusion clinic phone number for any questions.  Rocky FORBES Sar, RN

## 2023-12-02 ENCOUNTER — Other Ambulatory Visit: Payer: Self-pay

## 2023-12-02 DIAGNOSIS — Z79899 Other long term (current) drug therapy: Secondary | ICD-10-CM | POA: Diagnosis not present

## 2023-12-02 DIAGNOSIS — G43909 Migraine, unspecified, not intractable, without status migrainosus: Secondary | ICD-10-CM | POA: Diagnosis not present

## 2023-12-08 ENCOUNTER — Ambulatory Visit

## 2023-12-15 ENCOUNTER — Ambulatory Visit

## 2023-12-16 ENCOUNTER — Telehealth: Payer: Self-pay | Admitting: Medical Oncology

## 2023-12-16 NOTE — Telephone Encounter (Signed)
 Is it okay for her to take integra plus  ?   She is allergic to IV iron .

## 2023-12-20 DIAGNOSIS — F322 Major depressive disorder, single episode, severe without psychotic features: Secondary | ICD-10-CM | POA: Diagnosis not present

## 2023-12-20 DIAGNOSIS — E782 Mixed hyperlipidemia: Secondary | ICD-10-CM | POA: Diagnosis not present

## 2023-12-26 DIAGNOSIS — R399 Unspecified symptoms and signs involving the genitourinary system: Secondary | ICD-10-CM | POA: Diagnosis not present

## 2023-12-26 DIAGNOSIS — N390 Urinary tract infection, site not specified: Secondary | ICD-10-CM | POA: Diagnosis not present

## 2023-12-26 DIAGNOSIS — N200 Calculus of kidney: Secondary | ICD-10-CM | POA: Diagnosis not present

## 2024-01-01 DIAGNOSIS — N390 Urinary tract infection, site not specified: Secondary | ICD-10-CM | POA: Diagnosis not present

## 2024-01-01 DIAGNOSIS — K219 Gastro-esophageal reflux disease without esophagitis: Secondary | ICD-10-CM | POA: Diagnosis not present

## 2024-01-01 DIAGNOSIS — R399 Unspecified symptoms and signs involving the genitourinary system: Secondary | ICD-10-CM | POA: Diagnosis not present

## 2024-01-01 DIAGNOSIS — N200 Calculus of kidney: Secondary | ICD-10-CM | POA: Diagnosis not present

## 2024-01-01 DIAGNOSIS — Z6827 Body mass index (BMI) 27.0-27.9, adult: Secondary | ICD-10-CM | POA: Diagnosis not present

## 2024-01-19 ENCOUNTER — Inpatient Hospital Stay: Admitting: Internal Medicine

## 2024-01-19 ENCOUNTER — Inpatient Hospital Stay: Attending: Internal Medicine

## 2024-02-19 DIAGNOSIS — N200 Calculus of kidney: Secondary | ICD-10-CM | POA: Diagnosis not present

## 2024-02-19 DIAGNOSIS — N209 Urinary calculus, unspecified: Secondary | ICD-10-CM | POA: Diagnosis not present

## 2024-02-19 DIAGNOSIS — R3915 Urgency of urination: Secondary | ICD-10-CM | POA: Diagnosis not present

## 2024-02-19 DIAGNOSIS — N390 Urinary tract infection, site not specified: Secondary | ICD-10-CM | POA: Diagnosis not present

## 2024-02-19 DIAGNOSIS — R399 Unspecified symptoms and signs involving the genitourinary system: Secondary | ICD-10-CM | POA: Diagnosis not present

## 2024-02-19 DIAGNOSIS — R8281 Pyuria: Secondary | ICD-10-CM | POA: Diagnosis not present

## 2024-08-04 ENCOUNTER — Ambulatory Visit: Admitting: Internal Medicine
